# Patient Record
Sex: Male | Born: 1975 | Race: White | Hispanic: No | Marital: Single | Smoking: Never smoker
Health system: Southern US, Community
[De-identification: ages and names within clinical notes are randomized; demographics above are authoritative.]

## PROBLEM LIST (undated history)

## (undated) DIAGNOSIS — E119 Type 2 diabetes mellitus without complications: Secondary | ICD-10-CM

## (undated) HISTORY — PX: MOLE REMOVAL: SHX2046

## (undated) HISTORY — DX: Type 2 diabetes mellitus without complications: E11.9

## (undated) HISTORY — PX: MOUTH SURGERY: SHX715

---

## 2014-01-31 LAB — HEPATIC FUNCTION PANEL
ALT: 38 U/L (ref 10–40)
AST: 24 U/L (ref 14–40)
Alkaline Phosphatase: 88 U/L (ref 25–125)
Bilirubin, Total: 0.7 mg/dL

## 2014-01-31 LAB — TSH: TSH: 3.33 u[IU]/mL (ref 0.41–5.90)

## 2014-01-31 LAB — LIPID PANEL
Cholesterol: 222 mg/dL — AB (ref 0–200)
HDL: 52 mg/dL (ref 35–70)
LDL Cholesterol: 136 mg/dL
LDL/HDL RATIO: 2.6
Triglycerides: 170 mg/dL — AB (ref 40–160)

## 2014-01-31 LAB — BASIC METABOLIC PANEL
BUN: 12 mg/dL (ref 4–21)
CREATININE: 1.2 mg/dL (ref 0.6–1.3)
Glucose: 114 mg/dL
Potassium: 4.2 mmol/L (ref 3.4–5.3)
SODIUM: 139 mmol/L (ref 137–147)

## 2014-01-31 LAB — CBC AND DIFFERENTIAL
HCT: 44 % (ref 41–53)
HEMOGLOBIN: 15.2 g/dL (ref 13.5–17.5)
NEUTROS ABS: 3 /uL
Platelets: 277 10*3/uL (ref 150–399)
WBC: 6.9 10^3/mL

## 2014-11-07 ENCOUNTER — Ambulatory Visit
Admission: RE | Admit: 2014-11-07 | Discharge: 2014-11-07 | Disposition: A | Discharge status: Home / Self Care | Payer: BC Managed Care – PPO | Source: Ambulatory Visit | Attending: Family Medicine | Admitting: Family Medicine

## 2014-11-07 ENCOUNTER — Ambulatory Visit
Admission: RE | Admit: 2014-11-07 | Discharge: 2014-11-07 | Disposition: A | Discharge status: Home / Self Care | Payer: BC Managed Care – PPO | Attending: Family Medicine | Admitting: Family Medicine

## 2014-11-07 ENCOUNTER — Other Ambulatory Visit: Payer: Self-pay | Admitting: Family Medicine

## 2014-11-07 DIAGNOSIS — M5134 Other intervertebral disc degeneration, thoracic region: Secondary | ICD-10-CM | POA: Diagnosis not present

## 2014-11-07 DIAGNOSIS — M549 Dorsalgia, unspecified: Secondary | ICD-10-CM | POA: Diagnosis present

## 2015-05-30 DIAGNOSIS — M5134 Other intervertebral disc degeneration, thoracic region: Secondary | ICD-10-CM | POA: Insufficient documentation

## 2015-05-30 DIAGNOSIS — D229 Melanocytic nevi, unspecified: Secondary | ICD-10-CM | POA: Insufficient documentation

## 2015-05-30 DIAGNOSIS — IMO0002 Reserved for concepts with insufficient information to code with codable children: Secondary | ICD-10-CM | POA: Insufficient documentation

## 2015-06-06 ENCOUNTER — Ambulatory Visit (INDEPENDENT_AMBULATORY_CARE_PROVIDER_SITE_OTHER): Payer: BC Managed Care – PPO | Admitting: Family Medicine

## 2015-06-06 ENCOUNTER — Encounter: Payer: Self-pay | Admitting: Family Medicine

## 2015-06-06 VITALS — BP 128/82 | HR 68 | Temp 98.0°F | Resp 16 | Ht 72.0 in | Wt 280.0 lb

## 2015-06-06 DIAGNOSIS — Z0000 Encounter for general adult medical examination without abnormal findings: Secondary | ICD-10-CM

## 2015-06-06 NOTE — Progress Notes (Signed)
Patient ID: Connor Berger, male   DOB: Oct 05, 1975, 39 y.o.   MRN: [id number]  Visit Date: 06/06/2015  Today's Provider: Wilhemena Durie, MD   Chief Complaint  Patient presents with  . Annual Exam   Subjective:  Connor Berger is a 39 y.o. male who presents today for health maintenance and complete physical. He feels well. He  Does try to walk as much as he can and do back stretching exercise  He reports he is sleeping well. Immunization History  Administered Date(s) Administered  . Tdap 01/25/2013     Review of Systems  Constitutional: Negative.   HENT: Negative.   Eyes: Negative.   Respiratory: Negative.   Cardiovascular: Negative.   Gastrointestinal: Negative.   Endocrine: Negative.   Genitourinary: Negative.   Musculoskeletal: Positive for back pain.  Skin: Negative.   Allergic/Immunologic: Negative.   Neurological: Negative.   Hematological: Negative.   Psychiatric/Behavioral: Negative.     Social History   Social History  . Marital Status: Single    Spouse Name: N/A  . Number of Children: N/A  . Years of Education: N/A   Occupational History  . Not on file.   Social History Main Topics  . Smoking status: Never Smoker   . Smokeless tobacco: Never Used  . Alcohol Use: No  . Drug Use: No  . Sexual Activity: Not on file   Other Topics Concern  . Not on file   Social History Narrative    Patient Active Problem List   Diagnosis Date Noted  . 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 Adult BMI 30+ 05/30/2015    Past Surgical History  Procedure Laterality Date  . Mouth surgery    . Mole removal      not sure if it was cancerous    His family history includes Diabetes in his father and maternal grandmother.    Outpatient Prescriptions Prior to Visit  Medication Sig Dispense Refill  . cyclobenzaprine (FLEXERIL) 10 MG tablet Take by mouth.    . naproxen (NAPROSYN) 500 MG tablet Take  by mouth.     No facility-administered medications prior to visit.    Patient Care Team: Jerrol Banana., MD as PCP - General (Family Medicine)     Objective:   Vitals:  Filed Vitals:   06/06/15 0934  BP: 128/82  Pulse: 68  Temp: 98 F (36.7 C)  Resp: 16  Height: 6' (1.829 m)  Weight: 280 lb (127.007 kg)    Physical Exam     Assessment & Plan:   1. Annual physical exam DRE and PSA at age 25. - POCT urinalysis dipstick - CBC with Differential/Platelet - Comprehensive metabolic panel - Lipid Panel With LDL/HDL Ratio - TSH 2. Chronic thoracic back pain  Mild DDD noted. Chiropractor is helping. He does not seem to have any pain above or below this area. Will workup or treatment as indicated.  3. Dysplastic nevus  He has 2 lesions on his back that need follow-up with Dr. Evorn Gong next year.  4. Morbid obesity  Patient has no bad habits so diet and exercise is discussed at length.  I have done the exam and reviewed the above chart and it is accurate to the best of my knowledge.

## 2015-06-07 ENCOUNTER — Telehealth: Payer: Self-pay

## 2015-06-07 LAB — COMPREHENSIVE METABOLIC PANEL
A/G RATIO: 1.8 (ref 1.1–2.5)
ALBUMIN: 4.3 g/dL (ref 3.5–5.5)
ALK PHOS: 120 IU/L — AB (ref 39–117)
ALT: 82 IU/L — AB (ref 0–44)
AST: 46 IU/L — ABNORMAL HIGH (ref 0–40)
BILIRUBIN TOTAL: 0.7 mg/dL (ref 0.0–1.2)
BUN / CREAT RATIO: 10 (ref 8–19)
BUN: 10 mg/dL (ref 6–20)
CHLORIDE: 100 mmol/L (ref 97–106)
CO2: 26 mmol/L (ref 18–29)
Calcium: 9.8 mg/dL (ref 8.7–10.2)
Creatinine, Ser: 1.02 mg/dL (ref 0.76–1.27)
GFR calc non Af Amer: 93 mL/min/{1.73_m2} (ref >59)
GFR, EST AFRICAN AMERICAN: 108 mL/min/{1.73_m2} (ref >59)
Globulin, Total: 2.4 g/dL (ref 1.5–4.5)
Glucose: 145 mg/dL — ABNORMAL HIGH (ref 65–99)
POTASSIUM: 4.3 mmol/L (ref 3.5–5.2)
Sodium: 140 mmol/L (ref 136–144)
Total Protein: 6.7 g/dL (ref 6.0–8.5)

## 2015-06-07 LAB — CBC WITH DIFFERENTIAL/PLATELET
Basophils Absolute: 0.1 10*3/uL (ref 0.0–0.2)
Basos: 1 %
EOS (ABSOLUTE): 0.2 10*3/uL (ref 0.0–0.4)
EOS: 3 %
HEMATOCRIT: 46 % (ref 37.5–51.0)
HEMOGLOBIN: 16.1 g/dL (ref 12.6–17.7)
Immature Grans (Abs): 0 10*3/uL (ref 0.0–0.1)
Immature Granulocytes: 1 %
LYMPHS ABS: 2.4 10*3/uL (ref 0.7–3.1)
Lymphs: 37 %
MCH: 30.7 pg (ref 26.6–33.0)
MCHC: 35 g/dL (ref 31.5–35.7)
MCV: 88 fL (ref 79–97)
MONOCYTES: 8 %
MONOS ABS: 0.5 10*3/uL (ref 0.1–0.9)
NEUTROS ABS: 3.3 10*3/uL (ref 1.4–7.0)
Neutrophils: 50 %
Platelets: 286 10*3/uL (ref 150–379)
RBC: 5.25 x10E6/uL (ref 4.14–5.80)
RDW: 13.8 % (ref 12.3–15.4)
WBC: 6.5 10*3/uL (ref 3.4–10.8)

## 2015-06-07 LAB — LIPID PANEL WITH LDL/HDL RATIO
CHOLESTEROL TOTAL: 198 mg/dL (ref 100–199)
HDL: 47 mg/dL (ref >39)
LDL Calculated: 117 mg/dL — ABNORMAL HIGH (ref 0–99)
LDl/HDL Ratio: 2.5 ratio units (ref 0.0–3.6)
Triglycerides: 171 mg/dL — ABNORMAL HIGH (ref 0–149)
VLDL CHOLESTEROL CAL: 34 mg/dL (ref 5–40)

## 2015-06-07 LAB — TSH: TSH: 1.66 u[IU]/mL (ref 0.450–4.500)

## 2015-06-07 NOTE — Telephone Encounter (Signed)
-----   Message from Jerrol Banana., MD sent at 06/07/2015  1:46 PM EST ----- If fasting labs then this is early DM. OV in December to discuss how to apprioach this in this 39yo man.

## 2015-06-07 NOTE — Telephone Encounter (Signed)
Left message to call back  

## 2015-06-11 NOTE — Telephone Encounter (Signed)
[id number] 

## 2015-06-14 NOTE — Telephone Encounter (Signed)
Pt advised of lab results on his voicemail-aa

## 2015-06-26 ENCOUNTER — Ambulatory Visit (INDEPENDENT_AMBULATORY_CARE_PROVIDER_SITE_OTHER): Payer: BC Managed Care – PPO | Admitting: Family Medicine

## 2015-06-26 ENCOUNTER — Encounter: Payer: Self-pay | Admitting: Family Medicine

## 2015-06-26 VITALS — BP 118/72 | HR 80 | Temp 98.1°F | Resp 16 | Wt 287.0 lb

## 2015-06-26 DIAGNOSIS — E119 Type 2 diabetes mellitus without complications: Secondary | ICD-10-CM

## 2015-06-26 MED ORDER — METFORMIN HCL 500 MG PO TABS: 500.0000 mg | ORAL_TABLET | Freq: Every day | ORAL | Status: DC

## 2015-06-26 NOTE — Progress Notes (Signed)
Patient ID: Connor Berger, male   DOB: 1976/02/05, 39 y.o.   MRN: [id number]    Subjective:  HPI  Patient is here to follow up on elevates sugar level on his recent labs. He was fasting and his Glucose was 145 at that time. He has not had trouble with his sugar that he knows of. He does have family history of diabetes.  Prior to Admission medications   Medication Sig Start Date End Date Taking? Authorizing Provider  cyclobenzaprine (FLEXERIL) 10 MG tablet Take by mouth. 11/07/14  Yes Historical Provider, MD  Multiple Vitamin (MULTIVITAMIN) tablet Take 1 tablet by mouth daily.   Yes Historical Provider, MD  naproxen (NAPROSYN) 500 MG tablet Take by mouth. 10/24/14  Yes Historical Provider, MD    Patient Active Problem List   Diagnosis Date Noted  . 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 Adult BMI 30+ 05/30/2015    No past medical history on file.  Social History   Social History  . Marital Status: Single    Spouse Name: N/A  . Number of Children: N/A  . Years of Education: N/A   Occupational History  . Not on file.   Social History Main Topics  . Smoking status: Never Smoker   . Smokeless tobacco: Never Used  . Alcohol Use: No  . Drug Use: No  . Sexual Activity: Not on file   Other Topics Concern  . Not on file   Social History Narrative    No Known Allergies  Review of Systems  Constitutional: Negative.   Respiratory: Negative.   Cardiovascular: Negative.   Gastrointestinal: Negative.   Musculoskeletal: Positive for back pain and joint pain.  Neurological: Negative.   Endo/Heme/Allergies: Negative.   Psychiatric/Behavioral: Negative.     Immunization History  Administered Date(s) Administered  . Tdap 01/25/2013   Objective:  BP 118/72 mmHg  Pulse 80  Temp(Src) 98.1 F (36.7 C)  Resp 16  Wt 287 lb (130.182 kg)  Physical Exam  Constitutional: He is oriented to person, place, and time and  well-developed, well-nourished, and in no distress.  Obes WM NAD.  HENT:  Head: Normocephalic and atraumatic.  Eyes: Conjunctivae are normal. Pupils are equal, round, and reactive to light.  Neck: Normal range of motion. Neck supple.  Cardiovascular: Normal rate, regular rhythm, normal heart sounds and intact distal pulses.   No murmur heard. Pulmonary/Chest: Effort normal and breath sounds normal. No respiratory distress. He has no wheezes.  Musculoskeletal: Normal range of motion. He exhibits no edema or tenderness.  Neurological: He is alert and oriented to person, place, and time.  Skin: Skin is warm and dry.  Psychiatric: Mood, memory, affect and judgment normal.    Lab Results  Component Value Date   WBC 6.5 06/06/2015   HGB 15.2 01/31/2014   HCT 46.0 06/06/2015   PLT 277 01/31/2014   GLUCOSE 145* 06/06/2015   CHOL 198 06/06/2015   TRIG 171* 06/06/2015   HDL 47 06/06/2015   LDLCALC 117* 06/06/2015   TSH 1.660 06/06/2015    CMP     Component Value Date/Time   NA 140 06/06/2015 1017   K 4.3 06/06/2015 1017   CL 100 06/06/2015 1017   CO2 26 06/06/2015 1017   GLUCOSE 145* 06/06/2015 1017   BUN 10 06/06/2015 1017   CREATININE 1.02 06/06/2015 1017   CREATININE 1.2 01/31/2014   CALCIUM 9.8 06/06/2015 1017   PROT 6.7 06/06/2015 1017  ALBUMIN 4.3 06/06/2015 1017   AST 46* 06/06/2015 1017   ALT 82* 06/06/2015 1017   ALKPHOS 120* 06/06/2015 1017   BILITOT 0.7 06/06/2015 1017   GFRNONAA 93 06/06/2015 1017   GFRAA 108 06/06/2015 1017    Assessment and Plan :  1. Type 2 diabetes mellitus without complication, without long-term current use of insulin ([place]) New. A1C today 8.6. Start Metformin and refer to lifestyle also. - POCT HgB A1C--8.6 today. RTC 4 months. [place] MD [facility] 06/26/2015 3:22 PM

## 2015-07-23 ENCOUNTER — Encounter: Payer: Self-pay | Admitting: Physician Assistant

## 2015-07-23 ENCOUNTER — Ambulatory Visit (INDEPENDENT_AMBULATORY_CARE_PROVIDER_SITE_OTHER): Payer: BC Managed Care – PPO | Admitting: Physician Assistant

## 2015-07-23 VITALS — BP 120/70 | HR 88 | Temp 100.2°F | Resp 16 | Wt 274.6 lb

## 2015-07-23 DIAGNOSIS — J0110 Acute frontal sinusitis, unspecified: Secondary | ICD-10-CM

## 2015-07-23 DIAGNOSIS — R059 Cough, unspecified: Secondary | ICD-10-CM

## 2015-07-23 DIAGNOSIS — R05 Cough: Secondary | ICD-10-CM

## 2015-07-23 MED ORDER — HYDROCODONE-HOMATROPINE 5-1.5 MG/5ML PO SYRP: 5.0000 mL | ORAL_SOLUTION | Freq: Three times a day (TID) | ORAL | Status: DC | PRN

## 2015-07-23 MED ORDER — AMOXICILLIN-POT CLAVULANATE 875-125 MG PO TABS: 1.0000 | ORAL_TABLET | Freq: Two times a day (BID) | ORAL | Status: DC

## 2015-07-23 NOTE — Patient Instructions (Signed)

## 2015-07-23 NOTE — Progress Notes (Signed)
Patient: Connor Berger Male    DOB: May 22, 1976   40 y.o.   MRN: [id number] Visit Date: 07/23/2015  Today's Provider: Mar Daring, PA-C   Chief Complaint  Patient presents with  . URI   Subjective:    URI  This is a new problem. The current episode started in the past 7 days. There has been no fever (just feeling warm). Associated symptoms include congestion, coughing, headaches (off and on since Christmas), rhinorrhea, sinus pain and sneezing. Pertinent negatives include no abdominal pain, chest pain, ear pain, nausea, plugged ear sensation, rash, sore throat, vomiting or wheezing.      No Known Allergies Previous Medications   CYCLOBENZAPRINE (FLEXERIL) 10 MG TABLET    Take by mouth.   METFORMIN (GLUCOPHAGE) 500 MG TABLET    Take 1 tablet (500 mg total) by mouth daily with breakfast.   MULTIPLE VITAMIN (MULTIVITAMIN) TABLET    Take 1 tablet by mouth daily.   NAPROXEN (NAPROSYN) 500 MG TABLET    Take by mouth. Reported on 07/23/2015    Review of Systems  Constitutional: Positive for chills and fatigue.  HENT: Positive for congestion, rhinorrhea, sinus pressure and sneezing. Negative for ear pain and sore throat.   Respiratory: Positive for cough. Negative for chest tightness, shortness of breath and wheezing.   Cardiovascular: Negative for chest pain.  Gastrointestinal: Negative for nausea, vomiting and abdominal pain.  Skin: Negative for rash.  Neurological: Positive for headaches (off and on since Christmas). Negative for dizziness.  All other systems reviewed and are negative.   Social History  Substance Use Topics  . Smoking status: Never Smoker   . Smokeless tobacco: Never Used  . Alcohol Use: No   Objective:   BP 120/70 mmHg  Pulse 88  Temp(Src) 100.2 F (37.9 C) (Oral)  Resp 16  Wt 274 lb 9.6 oz (124.558 kg)  SpO2 98%  Physical Exam  Constitutional: He appears well-developed and well-nourished. No distress.  HENT:  Head: Normocephalic and  atraumatic.  Right Ear: Hearing, tympanic membrane, external ear and ear canal normal. Tympanic membrane is not erythematous and not bulging. No middle ear effusion.  Left Ear: Hearing, tympanic membrane, external ear and ear canal normal. Tympanic membrane is not erythematous and not bulging.  No middle ear effusion.  Nose: Mucosal edema and rhinorrhea present. Right sinus exhibits frontal sinus tenderness. Right sinus exhibits no maxillary sinus tenderness. Left sinus exhibits frontal sinus tenderness. Left sinus exhibits no maxillary sinus tenderness.  Mouth/Throat: Uvula is midline, oropharynx is clear and moist and mucous membranes are normal. No oropharyngeal exudate, posterior oropharyngeal edema or posterior oropharyngeal erythema.  Eyes: Conjunctivae and EOM are normal. Pupils are equal, round, and reactive to light. Right eye exhibits no discharge. Left eye exhibits no discharge.  Neck: Normal range of motion. Neck supple. No tracheal deviation present. No Brudzinski's sign and no Kernig's sign noted. No thyromegaly present.  Cardiovascular: Normal rate, regular rhythm and normal heart sounds.  Exam reveals no gallop and no friction rub.   No murmur heard. Pulmonary/Chest: Effort normal and breath sounds normal. No stridor. No respiratory distress. He has no wheezes. He has no rales.  Lymphadenopathy:    He has no cervical adenopathy.  Skin: Skin is warm and dry. He is not diaphoretic.  Vitals reviewed.       Assessment & Plan:     1. Acute frontal sinusitis, recurrence not specified Worsening symptoms since Christmas without response to OTC  medications. I will treat with Augmentin as below. Advised him to make sure that he stays well hydrated and get plenty of rest. He may use Mucinex DM for congestion. Office if symptoms fail to improve or worsen. - amoxicillin-clavulanate (AUGMENTIN) 875-125 MG tablet; Take 1 tablet by mouth 2 (two) times daily.  Dispense: 20 tablet; Refill:  0  2. Cough Worsening nighttime cough over the last 3 days. I will prescribe Hycodan cough syrup as below. He is to call the office if cough worsens or if he developedshortness of breath or wheezing. - HYDROcodone-homatropine (HYCODAN) 5-1.5 MG/5ML syrup; Take 5 mLs by mouth every 8 (eight) hours as needed for cough.  Dispense: 120 mL; Refill: 0       Mar Daring, PA-C  [facility]

## 2015-10-29 ENCOUNTER — Ambulatory Visit (INDEPENDENT_AMBULATORY_CARE_PROVIDER_SITE_OTHER): Payer: BC Managed Care – PPO | Admitting: Family Medicine

## 2015-10-29 VITALS — BP 112/76 | HR 64 | Temp 97.9°F | Resp 16 | Wt 267.0 lb

## 2015-10-29 DIAGNOSIS — E119 Type 2 diabetes mellitus without complications: Secondary | ICD-10-CM

## 2015-10-29 NOTE — Progress Notes (Signed)
Patient ID: Connor Berger, male   DOB: 1976/06/08, 40 y.o.   MRN: [id number]   Connor Berger  MRN: [id number] DOB: 1976/04/08  Subjective:  HPI   1. Type 2 diabetes mellitus without complication, without long-term current use of insulin [facility]) The patient is a 40 year old male who is a newly diagnosed diabetic.  He was last seen for this on 06/26/15.  He was started on Metformin 500 mg daily at that time.  He reports that he checks his glucose a few times per week and reports his readings have ranged 78-100.  He did not go to the [facility] per our referral.  He stated that he has 2 family members with diabetes and they are instructing him on diet.  While in the office today I have given him information of eye and foot care in diabetics and he voices Connor understanding.  During his December visit Connor A1C was ordered but the patient did not have this done.   Patient Active Problem List   Diagnosis Date Noted  . 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 Adult BMI 30+ 05/30/2015    No past medical history on file.  Social History   Social History  . Marital Status: Single    Spouse Name: N/A  . Number of Children: N/A  . Years of Education: N/A   Occupational History  . Not on file.   Social History Main Topics  . Smoking status: Never Smoker   . Smokeless tobacco: Never Used  . Alcohol Use: No  . Drug Use: No  . Sexual Activity: Not on file   Other Topics Concern  . Not on file   Social History Narrative    Outpatient Prescriptions Prior to Visit  Medication Sig Dispense Refill  . cyclobenzaprine (FLEXERIL) 10 MG tablet Take by mouth.    . metFORMIN (GLUCOPHAGE) 500 MG tablet Take 1 tablet (500 mg total) by mouth daily with breakfast. 90 tablet 3  . Multiple Vitamin (MULTIVITAMIN) tablet Take 1 tablet by mouth daily.    . naproxen (NAPROSYN) 500 MG tablet Take by mouth. Reported on 07/23/2015    .  amoxicillin-clavulanate (AUGMENTIN) 875-125 MG tablet Take 1 tablet by mouth 2 (two) times daily. 20 tablet 0  . HYDROcodone-homatropine (HYCODAN) 5-1.5 MG/5ML syrup Take 5 mLs by mouth every 8 (eight) hours as needed for cough. 120 mL 0   No facility-administered medications prior to visit.   Outpatient Encounter Prescriptions as of 10/29/2015  Medication Sig Note  . cyclobenzaprine (FLEXERIL) 10 MG tablet Take by mouth. 05/30/2015: Received from: [facility]  . metFORMIN (GLUCOPHAGE) 500 MG tablet Take 1 tablet (500 mg total) by mouth daily with breakfast.   . Multiple Vitamin (MULTIVITAMIN) tablet Take 1 tablet by mouth daily.   . naproxen (NAPROSYN) 500 MG tablet Take by mouth. Reported on 07/23/2015 05/30/2015: Received from: [facility]  . [DISCONTINUED] amoxicillin-clavulanate (AUGMENTIN) 875-125 MG tablet Take 1 tablet by mouth 2 (two) times daily.   . [DISCONTINUED] HYDROcodone-homatropine (HYCODAN) 5-1.5 MG/5ML syrup Take 5 mLs by mouth every 8 (eight) hours as needed for cough.    No facility-administered encounter medications on file as of 10/29/2015.   No Known Allergies  Review of Systems  Constitutional: Negative for fever and malaise/fatigue.  Eyes: Negative.   Respiratory: Negative for cough, shortness of breath and wheezing.   Cardiovascular: Negative for chest pain, palpitations, orthopnea, claudication and leg swelling.  Gastrointestinal: Negative.   Neurological: Negative for dizziness, weakness and headaches.  Endo/Heme/Allergies: Negative.   Psychiatric/Behavioral: Negative.    Objective:  BP 112/76 mmHg  Pulse 64  Temp(Src) 97.9 F (36.6 C) (Oral)  Resp 16  Wt 267 lb (121.11 kg)  Physical Exam  Constitutional: He is oriented to person, place, and time and well-developed, well-nourished, and in no distress.  HENT:  Head: Normocephalic.  Eyes: Pupils are equal, round, and reactive to light.  Neck: Normal range of motion.    Cardiovascular: Normal rate, regular rhythm and normal heart sounds.   Pulmonary/Chest: Effort normal and breath sounds normal.  Musculoskeletal: Edema: trace bilat.  Neurological: He is alert and oriented to person, place, and time. Gait normal.  Skin: Skin is warm and dry.  Psychiatric: Mood, memory, affect and judgment normal.    Assessment and Plan :   1. Type 2 diabetes mellitus without complication, without long-term current use of insulin ([facility])  - Comprehensive metabolic panel - Hemoglobin A1c 2. Obesity D and E stressed to pt. I have done the exam and reviewed the above chart and it is accurate to the best of my knowledge.  Miguel Aschoff MD [facility] 10/29/2015 9:47 AM

## 2015-10-30 ENCOUNTER — Telehealth: Payer: Self-pay

## 2015-10-30 LAB — COMPREHENSIVE METABOLIC PANEL
ALT: 24 IU/L (ref 0–44)
AST: 24 IU/L (ref 0–40)
Albumin/Globulin Ratio: 1.7 (ref 1.2–2.2)
Albumin: 4.4 g/dL (ref 3.5–5.5)
Alkaline Phosphatase: 96 IU/L (ref 39–117)
BUN/Creatinine Ratio: 13 (ref 9–20)
BUN: 14 mg/dL (ref 6–20)
Bilirubin Total: 0.7 mg/dL (ref 0.0–1.2)
CALCIUM: 9.6 mg/dL (ref 8.7–10.2)
CO2: 27 mmol/L (ref 18–29)
Chloride: 101 mmol/L (ref 96–106)
Creatinine, Ser: 1.11 mg/dL (ref 0.76–1.27)
GFR calc Af Amer: 98 mL/min/{1.73_m2} (ref >59)
GFR calc non Af Amer: 84 mL/min/{1.73_m2} (ref >59)
Globulin, Total: 2.6 g/dL (ref 1.5–4.5)
Glucose: 105 mg/dL — ABNORMAL HIGH (ref 65–99)
Potassium: 4.4 mmol/L (ref 3.5–5.2)
Sodium: 142 mmol/L (ref 134–144)
Total Protein: 7 g/dL (ref 6.0–8.5)

## 2015-10-30 LAB — HEMOGLOBIN A1C
ESTIMATED AVERAGE GLUCOSE: 163 mg/dL
HEMOGLOBIN A1C: 7.3 % — AB (ref 4.8–5.6)

## 2015-10-30 NOTE — Telephone Encounter (Signed)
-----   Message from Jerrol Banana., MD sent at 10/30/2015  8:59 AM EDT ----- Liver functions slightly improved from last visit, back to normal. Diabetes control good with hemoglobin A1c of 7.3. Continue to work on diet and exercise.

## 2015-10-30 NOTE — Telephone Encounter (Signed)
Left message to call back  

## 2015-11-05 NOTE — Telephone Encounter (Signed)
[id number] 

## 2015-11-07 NOTE — Telephone Encounter (Signed)
Called and spoke to his mother, patient works till 5 pm and we always have hard time getting back in touch with him, advised mother of results and advised her to let patient know that if he has further questions he can call us back-aa

## 2015-11-09 NOTE — Telephone Encounter (Signed)
Advised patient of results.  

## 2016-01-17 ENCOUNTER — Encounter: Payer: Self-pay | Admitting: Physician Assistant

## 2016-01-17 ENCOUNTER — Ambulatory Visit (INDEPENDENT_AMBULATORY_CARE_PROVIDER_SITE_OTHER): Payer: BC Managed Care – PPO | Admitting: Physician Assistant

## 2016-01-17 VITALS — BP 116/80 | HR 76 | Temp 98.2°F | Resp 16 | Wt 266.4 lb

## 2016-01-17 DIAGNOSIS — L259 Unspecified contact dermatitis, unspecified cause: Secondary | ICD-10-CM | POA: Diagnosis not present

## 2016-01-17 MED ORDER — PREDNISONE 10 MG (21) PO TBPK: ORAL_TABLET | ORAL | Status: DC

## 2016-01-17 NOTE — Progress Notes (Signed)
       Patient: Connor Berger    DOB: Jul 11, 1975   40 y.o.   MRN: [id number] Visit Date: 01/17/2016  Today's Provider: Mar Daring, PA-C   Chief Complaint  Patient presents with  . Rash   Subjective:    Rash This is a new problem. The current episode started 1 to 4 weeks ago (over a week ago ). The problem is unchanged. The affected locations include the left lower leg and right lowerleg. The rash is characterized by itchiness and redness. It is unknown if there was an exposure to a precipitant. Pertinent negatives include no congestion, cough, facial edema, fatigue, fever, joint pain, rhinorrhea, shortness of breath or sore throat. Past treatments include topical steroids and antibiotic cream (Aloe Vera it only helps with the itching and Neosporin). The treatment provided no relief.      No Known Allergies Current Meds  Medication Sig  . metFORMIN (GLUCOPHAGE) 500 MG tablet Take 1 tablet (500 mg total) by mouth daily with breakfast.  . Multiple Vitamin (MULTIVITAMIN) tablet Take 1 tablet by mouth daily.    Review of Systems  Constitutional: Negative for fever and fatigue.  HENT: Negative for congestion, rhinorrhea and sore throat.   Respiratory: Negative for cough, chest tightness and shortness of breath.   Cardiovascular: Negative.   Gastrointestinal: Negative.   Musculoskeletal: Negative for joint pain.  Skin: Positive for rash.    Social History  Substance Use Topics  . Smoking status: Never Smoker   . Smokeless tobacco: Never Used  . Alcohol Use: No   Objective:   BP 116/80 mmHg  Pulse 76  Temp(Src) 98.2 F (36.8 C) (Oral)  Resp 16  Wt 266 lb 6.4 oz (120.838 kg)  Physical Exam  Constitutional: He appears well-developed and well-nourished. No distress.  HENT:  Head: Normocephalic and atraumatic.  Cardiovascular: Normal rate, regular rhythm and normal heart sounds.  Exam reveals no gallop and no friction rub.   No murmur  heard. Pulmonary/Chest: Effort normal and breath sounds normal. No respiratory distress. He has no wheezes. He has no rales.  Skin: Rash noted. Rash is vesicular. He is not diaphoretic.     Vitals reviewed.       Assessment & Plan:     1. Contact dermatitis Worsening despite topical treatments. Will add prednisone as below. Advised to try topical hydrocortisone cream and/or calamine lotion for itch. He agrees. He is to call the office if symptoms fail to improve or worsen. - predniSONE (STERAPRED UNI-PAK 21 TAB) 10 MG (21) TBPK tablet; Take as directed on package instructions  Dispense: 21 tablet; Refill: 0       Mar Daring, PA-C  [facility]

## 2016-01-17 NOTE — Patient Instructions (Signed)
Contact Dermatitis Dermatitis is redness, soreness, and swelling (inflammation) of the skin. Contact dermatitis is a reaction to certain substances that touch the skin. There are two types of contact dermatitis:   Irritant contact dermatitis. This type is caused by something that irritates your skin, such as dry hands from washing them too much. This type does not require previous exposure to the substance for a reaction to occur. This type is more common.  Allergic contact dermatitis. This type is caused by a substance that you are allergic to, such as a nickel allergy or poison ivy. This type only occurs if you have been exposed to the substance (allergen) before. Upon a repeat exposure, your body reacts to the substance. This type is less common. CAUSES  Many different substances can cause contact dermatitis. Irritant contact dermatitis is most commonly caused by exposure to:   Makeup.   Soaps.   Detergents.   Bleaches.   Acids.   Metal salts, such as nickel.  Allergic contact dermatitis is most commonly caused by exposure to:   Poisonous plants.   Chemicals.   Jewelry.   Latex.   Medicines.   Preservatives in products, such as clothing.  RISK FACTORS This condition is more likely to develop in:   People who have jobs that expose them to irritants or allergens.  People who have certain medical conditions, such as asthma or eczema.  SYMPTOMS  Symptoms of this condition may occur anywhere on your body where the irritant has touched you or is touched by you. Symptoms include:  Dryness or flaking.   Redness.   Cracks.   Itching.   Pain or a burning feeling.   Blisters.  Drainage of small amounts of blood or clear fluid from skin cracks. With allergic contact dermatitis, there may also be swelling in areas such as the eyelids, mouth, or genitals.  DIAGNOSIS  This condition is diagnosed with a medical history and physical exam. A patch skin test  may be performed to help determine the cause. If the condition is related to your job, you may need to see an occupational medicine specialist. TREATMENT Treatment for this condition includes figuring out what caused the reaction and protecting your skin from further contact. Treatment may also include:   Steroid creams or ointments. Oral steroid medicines may be needed in more severe cases.  Antibiotics or antibacterial ointments, if a skin infection is present.  Antihistamine lotion or an antihistamine taken by mouth to ease itching.  A bandage (dressing). HOME CARE INSTRUCTIONS Skin Care  Moisturize your skin as needed.   Apply cool compresses to the affected areas.  Try taking a bath with:  Epsom salts. Follow the instructions on the packaging. You can get these at your local pharmacy or grocery store.  Baking soda. Pour a small amount into the bath as directed by your health care provider.  Colloidal oatmeal. Follow the instructions on the packaging. You can get this at your local pharmacy or grocery store.  Try applying baking soda paste to your skin. Stir water into baking soda until it reaches a paste-like consistency.  Do not scratch your skin.  Bathe less frequently, such as every other day.  Bathe in lukewarm water. Avoid using hot water. Medicines  Take or apply over-the-counter and prescription medicines only as told by your health care provider.   If you were prescribed an antibiotic medicine, take or apply your antibiotic as told by your health care provider. Do not stop using the   antibiotic even if your condition starts to improve. General Instructions  Keep all follow-up visits as told by your health care provider. This is important.  Avoid the substance that caused your reaction. If you do not know what caused it, keep a journal to try to track what caused it. Write down:  What you eat.  What cosmetic products you use.  What you drink.  What  you wear in the affected area. This includes jewelry.  If you were given a dressing, take care of it as told by your health care provider. This includes when to change and remove it. SEEK MEDICAL CARE IF:   Your condition does not improve with treatment.  Your condition gets worse.  You have signs of infection such as swelling, tenderness, redness, soreness, or warmth in the affected area.  You have a fever.  You have new symptoms. SEEK IMMEDIATE MEDICAL CARE IF:   You have a severe headache, neck pain, or neck stiffness.  You vomit.  You feel very sleepy.  You notice red streaks coming from the affected area.  Your bone or joint underneath the affected area becomes painful after the skin has healed.  The affected area turns darker.  You have difficulty breathing.   This information is not intended to replace advice given to you by your health care provider. Make sure you discuss any questions you have with your health care provider.   Document Released: 06/20/2000 Document Revised: 03/14/2015 Document Reviewed: 11/08/2014 Elsevier Interactive Patient Education 2016 [employer]  

## 2016-01-18 ENCOUNTER — Encounter: Payer: Self-pay | Admitting: Physician Assistant

## 2016-03-03 ENCOUNTER — Encounter: Payer: Self-pay | Admitting: Family Medicine

## 2016-03-03 ENCOUNTER — Ambulatory Visit (INDEPENDENT_AMBULATORY_CARE_PROVIDER_SITE_OTHER): Payer: BC Managed Care – PPO | Admitting: Family Medicine

## 2016-03-03 VITALS — BP 110/68 | HR 80 | Temp 98.0°F | Resp 16 | Wt 261.0 lb

## 2016-03-03 DIAGNOSIS — E119 Type 2 diabetes mellitus without complications: Secondary | ICD-10-CM

## 2016-03-03 LAB — POCT GLYCOSYLATED HEMOGLOBIN (HGB A1C): HEMOGLOBIN A1C: 7

## 2016-03-03 LAB — POCT UA - MICROALBUMIN: Microalbumin Ur, POC: 50 mg/L

## 2016-03-03 MED ORDER — METFORMIN HCL 1000 MG PO TABS: 1000.0000 mg | ORAL_TABLET | Freq: Every day | ORAL | 12 refills | Status: DC

## 2016-03-03 MED ORDER — LOSARTAN POTASSIUM 25 MG PO TABS: 25.0000 mg | ORAL_TABLET | Freq: Every day | ORAL | 12 refills | Status: DC

## 2016-03-03 NOTE — Progress Notes (Signed)
Subjective:  HPI  Diabetes Mellitus Type II, Follow-up:   Lab Results  Component Value Date   HGBA1C 7.3 (H) 10/29/2015    Last seen for diabetes 3 months ago.  Management since then includes none. He reports good compliance with treatment. He is not having side effects.  Current symptoms include none and have been unchanged. Home blood sugar records: 100-150's  Episodes of hypoglycemia? no   Current Insulin Regimen: n/a Most Recent Eye Exam: 03/2015 Weight trend: stable Prior visit with dietician: no Current diet: not asked Current exercise: walking daily and more on the weekends.   Pertinent Labs:    Component Value Date/Time   CHOL 198 06/06/2015 1017   TRIG 171 (H) 06/06/2015 1017   HDL 47 06/06/2015 1017   LDLCALC 117 (H) 06/06/2015 1017   CREATININE 1.11 10/29/2015 1031    Wt Readings from Last 3 Encounters:  03/03/16 261 lb (118.4 kg)  01/17/16 266 lb 6.4 oz (120.8 kg)  10/29/15 267 lb (121.1 kg)    ------------------------------------------------------------------------    Prior to Admission medications   Medication Sig Start Date End Date Taking? Authorizing Provider  metFORMIN (GLUCOPHAGE) 500 MG tablet Take 1 tablet (500 mg total) by mouth daily with breakfast. 06/26/15  Yes Jerrol Banana., MD  Multiple Vitamin (MULTIVITAMIN) tablet Take 1 tablet by mouth daily.   Yes Historical Provider, MD    Patient Active Problem List   Diagnosis Date Noted  . 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 Adult BMI 30+ 05/30/2015    History reviewed. No pertinent past medical history.  Social History   Social History  . Marital status: Single    Spouse name: N/A  . Number of children: N/A  . Years of education: N/A   Occupational History  . Not on file.   Social History Main Topics  . Smoking status: Never Smoker  . Smokeless tobacco: Never Used  . Alcohol use No  . Drug use: No    . Sexual activity: Not on file   Other Topics Concern  . Not on file   Social History Narrative  . No narrative on file    No Known Allergies  Review of Systems  Constitutional: Negative.   HENT: Negative.   Eyes: Negative.   Respiratory: Negative.   Cardiovascular: Negative.   Gastrointestinal: Negative.   Genitourinary: Negative.   Musculoskeletal: Negative.   Skin: Negative.   Neurological: Negative.   Endo/Heme/Allergies: Negative.   Psychiatric/Behavioral: Negative.     Immunization History  Administered Date(s) Administered  . Tdap 01/25/2013   Objective:  BP 110/68 (BP Location: Left Arm, Patient Position: Sitting, Cuff Size: Large)   Pulse 80   Temp 98 F (36.7 C) (Oral)   Resp 16   Wt 261 lb (118.4 kg)   BMI 35.40 kg/m   Physical Exam  Constitutional: He is oriented to person, place, and time and well-developed, well-nourished, and in no distress.  Moderately obese white male in no acute distress.  HENT:  Head: Normocephalic and atraumatic.  Right Ear: External ear normal.  Left Ear: External ear normal.  Nose: Nose normal.  Eyes: Conjunctivae are normal.  Cardiovascular: Normal rate, regular rhythm and normal heart sounds.   Pulmonary/Chest: Effort normal and breath sounds normal.  Neurological: He is alert and oriented to person, place, and time.  Skin: Skin is warm and dry.  Psychiatric: Mood, memory, affect and judgment normal.  Lab Results  Component Value Date   WBC 6.5 06/06/2015   HGB 15.2 01/31/2014   HCT 46.0 06/06/2015   PLT 286 06/06/2015   GLUCOSE 105 (H) 10/29/2015   CHOL 198 06/06/2015   TRIG 171 (H) 06/06/2015   HDL 47 06/06/2015   LDLCALC 117 (H) 06/06/2015   TSH 1.660 06/06/2015   HGBA1C 7.3 (H) 10/29/2015    CMP     Component Value Date/Time   NA 142 10/29/2015 1031   K 4.4 10/29/2015 1031   CL 101 10/29/2015 1031   CO2 27 10/29/2015 1031   GLUCOSE 105 (H) 10/29/2015 1031   BUN 14 10/29/2015 1031    CREATININE 1.11 10/29/2015 1031   CALCIUM 9.6 10/29/2015 1031   PROT 7.0 10/29/2015 1031   ALBUMIN 4.4 10/29/2015 1031   AST 24 10/29/2015 1031   ALT 24 10/29/2015 1031   ALKPHOS 96 10/29/2015 1031   BILITOT 0.7 10/29/2015 1031   GFRNONAA 84 10/29/2015 1031   GFRAA 98 10/29/2015 1031    Assessment and Plan :  1. Type 2 diabetes mellitus without complication, without long-term current use of insulin (HCC) Due Micro albumin being elevated at 52 will start Losartan 25 mg daily for kidney protection and follow up 1-2 months. Diet and exercise stressed.  - POCT HgB A1C 7.0 today.  - metFORMIN (GLUCOPHAGE) 1000 MG tablet; Take 1 tablet (1,000 mg total) by mouth daily with supper.  Dispense: 30 tablet; Refill: 12 - POCT UA - Microalbumin - POCT HgB A1C - losartan (COZAAR) 25 MG tablet; Take 1 tablet (25 mg total) by mouth daily.  Dispense: 30 tablet; Refill: 12 2. Diabetic nephropathy/positive microalbumin Blood pressure is good but will start losartan 25 mg daily. 3. Obesity Weight loss through diet and exercise stressed. Patient was seen and examined by Dr. Miguel Aschoff, and noted scribed by Webb Laws, CMA I have done the exam and reviewed the above chart and it is accurate to the best of my knowledge.  Miguel Aschoff MD [facility] 03/03/2016 10:01 AM

## 2016-04-10 ENCOUNTER — Ambulatory Visit (INDEPENDENT_AMBULATORY_CARE_PROVIDER_SITE_OTHER): Payer: BC Managed Care – PPO | Admitting: Family Medicine

## 2016-04-10 VITALS — BP 114/76 | HR 76 | Temp 98.0°F | Resp 18 | Wt 266.0 lb

## 2016-04-10 DIAGNOSIS — I10 Essential (primary) hypertension: Secondary | ICD-10-CM | POA: Diagnosis not present

## 2016-04-10 DIAGNOSIS — E6609 Other obesity due to excess calories: Secondary | ICD-10-CM | POA: Diagnosis not present

## 2016-04-10 NOTE — Progress Notes (Signed)
Connor Berger  MRN: [id number] DOB: [birth year]  Subjective:  HPI   The patient is a 40 year old male who presents for follow up after starting on Losartan.  The patient was last seen on 03/03/16 and it was noted that his urine microalbumin was elevated.  He was therefor started on Losartan for kidney protection.  The patient states he is compliant with the medication until 3 days ago when he finished the original prescription and decided to wait until after he was seen today to refill the medication in the even it needed to be changed.  The patient does complain that since starting the medicine he has noted that he has been having more headache.  The patient states the intensity of the headaches has not increased but the frequency of the episode has increased.  He is now having headaches every 2-3 days.  He states that he has also noticed that since starting the medicine he gets sleepy m ore often.  If he sits for a period of time he feels he is going to fall asleep.  Patient Active Problem List   Diagnosis Date Noted  . 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 Adult BMI 30+ 05/30/2015    No past medical history on file.  Social History   Social History  . Marital status: Single    Spouse name: N/A  . Number of children: N/A  . Years of education: N/A   Occupational History  . Not on file.   Social History Main Topics  . Smoking status: Never Smoker  . Smokeless tobacco: Never Used  . Alcohol use No  . Drug use: No  . Sexual activity: Not on file   Other Topics Concern  . Not on file   Social History Narrative  . No narrative on file    Outpatient Encounter Prescriptions as of 04/10/2016  Medication Sig  . losartan (COZAAR) 25 MG tablet Take 1 tablet (25 mg total) by mouth daily.  . metFORMIN (GLUCOPHAGE) 1000 MG tablet Take 1 tablet (1,000 mg total) by mouth daily with supper.  . Multiple Vitamin  (MULTIVITAMIN) tablet Take 1 tablet by mouth daily.   No facility-administered encounter medications on file as of 04/10/2016.     No Known Allergies  Review of Systems  Constitutional: Negative for fever and malaise/fatigue.  Eyes: Negative.   Respiratory: Negative for cough, shortness of breath and wheezing.   Cardiovascular: Negative for chest pain, palpitations, orthopnea and leg swelling.  Gastrointestinal: Negative.   Neurological: Positive for headaches. Negative for dizziness and weakness.  Endo/Heme/Allergies: Negative.   Psychiatric/Behavioral: Negative.    Objective:  BP 114/76   Pulse 76   Temp 98 F (36.7 C) (Oral)   Resp 18   Wt 266 lb (120.7 kg)   BMI 36.08 kg/m   Physical Exam  Constitutional: He is well-developed, well-nourished, and in no distress.  HENT:  Head: Normocephalic and atraumatic.  Right Ear: External ear normal.  Left Ear: External ear normal.  Nose: Nose normal.  Eyes: Conjunctivae are normal. Pupils are equal, round, and reactive to light.  Neck: Normal range of motion.  Cardiovascular: Normal rate, regular rhythm and normal heart sounds.   Pulmonary/Chest: Effort normal and breath sounds normal.  Abdominal: Soft.  Skin: Skin is warm and dry.  Psychiatric: Mood, memory, affect and judgment normal.    Assessment and Plan :  HTN Obesity Prediabetes RTC 6 months I have  done the exam and reviewed the chart and it is accurate to the best of my knowledge. Miguel Aschoff M.D. [facility]      HPI, Exam and A&P Transcribed under the direction and in the presence of Wilhemena Durie., MD. Electronically Signed: Althea Charon, [place]

## 2016-05-06 ENCOUNTER — Encounter: Payer: Self-pay | Admitting: Physician Assistant

## 2016-05-06 ENCOUNTER — Ambulatory Visit (INDEPENDENT_AMBULATORY_CARE_PROVIDER_SITE_OTHER): Payer: BC Managed Care – PPO | Admitting: Physician Assistant

## 2016-05-06 VITALS — BP 112/80 | HR 84 | Temp 98.6°F | Resp 16 | Wt 263.0 lb

## 2016-05-06 DIAGNOSIS — J020 Streptococcal pharyngitis: Secondary | ICD-10-CM

## 2016-05-06 LAB — POCT RAPID STREP A (OFFICE): Rapid Strep A Screen: POSITIVE — AB

## 2016-05-06 MED ORDER — AMOXICILLIN 500 MG PO CAPS: 500.0000 mg | ORAL_CAPSULE | Freq: Two times a day (BID) | ORAL | 0 refills | Status: AC

## 2016-05-06 NOTE — Progress Notes (Signed)
Patient: Connor Berger Male    DOB: 01/18/1976   39 y.o.   MRN: [id number] Visit Date: 05/06/2016  Today's Provider: Trinna Post, PA-C   Chief Complaint  Patient presents with  . Sore Throat    Started Sunday   Subjective:    Sore Throat   This is a new problem. The current episode started in the past 7 days. The problem has been gradually worsening. Neither side of throat is experiencing more pain than the other. There has been no fever. Associated symptoms include coughing, headaches and trouble swallowing. Pertinent negatives include no congestion, ear discharge, ear pain, shortness of breath or stridor.       No Known Allergies   Current Outpatient Prescriptions:  .  metFORMIN (GLUCOPHAGE) 1000 MG tablet, Take 1 tablet (1,000 mg total) by mouth daily with supper., Disp: 30 tablet, Rfl: 12 .  Multiple Vitamin (MULTIVITAMIN) tablet, Take 1 tablet by mouth daily., Disp: , Rfl:   Review of Systems  Constitutional: Positive for fatigue. Negative for activity change, appetite change, chills, diaphoresis, fever and unexpected weight change.  HENT: Positive for sore throat and trouble swallowing. Negative for congestion, ear discharge, ear pain, postnasal drip, rhinorrhea, sinus pressure, sneezing and tinnitus.   Eyes: Negative.   Respiratory: Positive for cough. Negative for apnea, choking, chest tightness, shortness of breath, wheezing and stridor.   Gastrointestinal: Negative.   Neurological: Positive for headaches.    Social History  Substance Use Topics  . Smoking status: Never Smoker  . Smokeless tobacco: Never Used  . Alcohol use No   Objective:   BP 112/80 (BP Location: Left Arm, Patient Position: Sitting, Cuff Size: Large)   Pulse 84   Temp 98.6 F (37 C) (Oral)   Resp 16   Wt 263 lb (119.3 kg)   BMI 35.67 kg/m   Physical Exam  Constitutional: He is oriented to person, place, and time. He appears well-developed and well-nourished. No distress.   HENT:  Right Ear: External ear normal.  Left Ear: External ear normal.  Nose: Nose normal.  Mouth/Throat: Uvula is midline and mucous membranes are normal. Oropharyngeal exudate, posterior oropharyngeal edema and posterior oropharyngeal erythema present.  Eyes: Conjunctivae are normal.  Cardiovascular: Normal rate and regular rhythm.   Pulmonary/Chest: Effort normal and breath sounds normal. No respiratory distress. He has no wheezes.  Lymphadenopathy:    He has cervical adenopathy.  Neurological: He is alert and oriented to person, place, and time.  Skin: Skin is warm and dry.  Psychiatric: He has a normal mood and affect. His behavior is normal.  Vitals reviewed.       Assessment & Plan:      Problem List Items Addressed This Visit    None    Visit Diagnoses    Pharyngitis due to Streptococcus species    -  Primary   Relevant Medications   amoxicillin (AMOXIL) 500 MG capsule   Other Relevant Orders   POCT rapid strep A (Completed)     Patient is 40 y.o male presenting today with strep pharyngitis. Will treat as above. No PCN allergies. Call back if not feeling better. Contagious until 24 hours on abx. Work note provided.  Return if symptoms worsen or fail to improve.   Patient Instructions  Strep Throat Strep throat is a bacterial infection of the throat. Your health care provider may call the infection tonsillitis or pharyngitis, depending on whether there is swelling in the tonsils or  at the back of the throat. Strep throat is most common during the cold months of the year in children who are 77-45 years of age, but it can happen during any season in people of any age. This infection is spread from person to person (contagious) through coughing, sneezing, or close contact. CAUSES Strep throat is caused by the bacteria called Streptococcus pyogenes. RISK FACTORS This condition is more likely to develop in:  People who spend time in crowded places where the infection  can spread easily.  People who have close contact with someone who has strep throat. SYMPTOMS Symptoms of this condition include:  Fever or chills.   Redness, swelling, or pain in the tonsils or throat.  Pain or difficulty when swallowing.  White or yellow spots on the tonsils or throat.  Swollen, tender glands in the neck or under the jaw.  Red rash all over the body (rare). DIAGNOSIS This condition is diagnosed by performing a rapid strep test or by taking a swab of your throat (throat culture test). Results from a rapid strep test are usually ready in a few minutes, but throat culture test results are available after one or two days. TREATMENT This condition is treated with antibiotic medicine. HOME CARE INSTRUCTIONS Medicines  Take over-the-counter and prescription medicines only as told by your health care provider.  Take your antibiotic as told by your health care provider. Do not stop taking the antibiotic even if you start to feel better.  Have family members who also have a sore throat or fever tested for strep throat. They may need antibiotics if they have the strep infection. Eating and Drinking  Do not share food, drinking cups, or personal items that could cause the infection to spread to other people.  If swallowing is difficult, try eating soft foods until your sore throat feels better.  Drink enough fluid to keep your urine clear or pale yellow. General Instructions  Gargle with a salt-water mixture 3-4 times per day or as needed. To make a salt-water mixture, completely dissolve -1 tsp of salt in 1 cup of warm water.  Make sure that all household members wash their hands well.  Get plenty of rest.  Stay home from school or work until you have been taking antibiotics for 24 hours.  Keep all follow-up visits as told by your health care provider. This is important. SEEK MEDICAL CARE IF:  The glands in your neck continue to get bigger.  You develop a  rash, cough, or earache.  You cough up a thick liquid that is green, yellow-brown, or bloody.  You have pain or discomfort that does not get better with medicine.  Your problems seem to be getting worse rather than better.  You have a fever. SEEK IMMEDIATE MEDICAL CARE IF:  You have new symptoms, such as vomiting, severe headache, stiff or painful neck, chest pain, or shortness of breath.  You have severe throat pain, drooling, or changes in your voice.  You have swelling of the neck, or the skin on the neck becomes red and tender.  You have signs of dehydration, such as fatigue, dry mouth, and decreased urination.  You become increasingly sleepy, or you cannot wake up completely.  Your joints become red or painful.   This information is not intended to replace advice given to you by your health care provider. Make sure you discuss any questions you have with your health care provider.   Document Released: 06/20/2000 Document Revised: 03/14/2015 Document  Reviewed: 10/16/2014 Elsevier Interactive Patient Education [employer].    The entirety of the information documented in the History of Present Illness, Review of Systems and Physical Exam were personally obtained by me. Portions of this information were initially documented by Ashley Royalty, CMA and reviewed by me for thoroughness and accuracy.        Trinna Post, PA-C  [facility]

## 2016-05-06 NOTE — Patient Instructions (Signed)

## 2016-05-08 ENCOUNTER — Ambulatory Visit: Payer: Self-pay | Admitting: Family Medicine

## 2016-07-14 ENCOUNTER — Ambulatory Visit: Payer: BC Managed Care – PPO | Admitting: Family Medicine

## 2016-08-14 ENCOUNTER — Ambulatory Visit (INDEPENDENT_AMBULATORY_CARE_PROVIDER_SITE_OTHER): Payer: BC Managed Care – PPO | Admitting: Family Medicine

## 2016-08-14 VITALS — BP 122/74 | HR 80 | Temp 98.4°F | Resp 14 | Wt 263.0 lb

## 2016-08-14 DIAGNOSIS — E119 Type 2 diabetes mellitus without complications: Secondary | ICD-10-CM | POA: Diagnosis not present

## 2016-08-14 DIAGNOSIS — E6609 Other obesity due to excess calories: Secondary | ICD-10-CM

## 2016-08-14 DIAGNOSIS — I10 Essential (primary) hypertension: Secondary | ICD-10-CM | POA: Diagnosis not present

## 2016-08-14 LAB — POCT GLYCOSYLATED HEMOGLOBIN (HGB A1C): HEMOGLOBIN A1C: 6.6

## 2016-08-14 NOTE — Progress Notes (Signed)
   Connor Berger  MRN: [id number] DOB: 12-04-1975  Subjective:  HPI  Patient is here for follow up, Last office visit was on 04/11/16. BP Readings from Last 3 Encounters:  08/14/16 122/74  05/06/16 112/80  04/10/16 114/76   Patient is checking his sugar in the morning fasting, readings have been around 100-120. No hypoglycemic episodes. No numbness or tingling. Lab Results  Component Value Date   HGBA1C 7.0 03/03/2016   Last labs [facility] 10/29/15, the rest were done on 06/06/15  Patient Active Problem List   Diagnosis Date Noted  . 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 Adult BMI 30+ 05/30/2015    No past medical history on file.  Social History   Social History  . Marital status: Single    Spouse name: N/A  . Number of children: N/A  . Years of education: N/A   Occupational History  . Not on file.   Social History Main Topics  . Smoking status: Never Smoker  . Smokeless tobacco: Never Used  . Alcohol use No  . Drug use: No  . Sexual activity: Not on file   Other Topics Concern  . Not on file   Social History Narrative  . No narrative on file    Outpatient Encounter Prescriptions as of 08/14/2016  Medication Sig  . metFORMIN (GLUCOPHAGE) 1000 MG tablet Take 1 tablet (1,000 mg total) by mouth daily with supper.  . Multiple Vitamin (MULTIVITAMIN) tablet Take 1 tablet by mouth daily.   No facility-administered encounter medications on file as of 08/14/2016.     No Known Allergies  Review of Systems  Constitutional: Negative.   Respiratory: Negative.   Cardiovascular: Negative.   Gastrointestinal: Negative.        Upset stomach at times with eating too much or too fast.  Musculoskeletal: Positive for back pain (due to DDD and seen chiropractor for this.).  Psychiatric/Behavioral: Negative.     Objective:  BP 122/74   Pulse 80   Temp 98.4 F (36.9 C)   Resp 14   Wt 263 lb (119.3 kg)   BMI  35.67 kg/m   Physical Exam  Constitutional: He is oriented to person, place, and time and well-developed, well-nourished, and in no distress.  HENT:  Head: Normocephalic and atraumatic.  Eyes: Conjunctivae are normal. Pupils are equal, round, and reactive to light.  Neck: Normal range of motion. Neck supple.  Cardiovascular: Normal rate, regular rhythm, normal heart sounds and intact distal pulses.   No murmur heard. Pulmonary/Chest: Effort normal and breath sounds normal. No respiratory distress. He has no wheezes.  Neurological: He is alert and oriented to person, place, and time.    Assessment and Plan :  1. Type 2 diabetes mellitus without complication, without long-term current use of insulin (HCC) A1C 6.6. Better. - POCT HgB A1C  2. Essential hypertension  3. Class 1 obesity due to excess calories with serious comorbidity in adult, unspecified BMI  HPI, Exam and A&P transcribed under direction and in the presence of Miguel Aschoff, MD.  I have done the exam and reviewed the chart and it is accurate to the best of my knowledge. Development worker, community has been used and  any errors in dictation or transcription are unintentional. Miguel Aschoff M.D. [facility]

## 2016-09-09 ENCOUNTER — Ambulatory Visit (INDEPENDENT_AMBULATORY_CARE_PROVIDER_SITE_OTHER): Payer: BC Managed Care – PPO | Admitting: Family Medicine

## 2016-09-09 VITALS — BP 122/76 | HR 80 | Temp 98.4°F | Resp 16 | Wt 264.0 lb

## 2016-09-09 DIAGNOSIS — J020 Streptococcal pharyngitis: Secondary | ICD-10-CM

## 2016-09-09 MED ORDER — AMOXICILLIN 500 MG PO CAPS: 500.0000 mg | ORAL_CAPSULE | Freq: Three times a day (TID) | ORAL | 0 refills | Status: DC

## 2016-09-09 NOTE — Progress Notes (Signed)
   Connor Berger  MRN: [id number] DOB: [birth year]  Subjective:  HPI  Patient is here with a complaint of sore throat. Symptoms started yesterday 09/08/16 and got worse over night. Describes sensation as a raw and scratchy feeling. He used chloraseptic spray for his throat and that did help. He has had some headaches and had minor chills yesterday afternoon. No fever or body aches, no drainage or runny nose, no ear pain.  Patient Active Problem List   Diagnosis Date Noted  . 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 Adult BMI 30+ 05/30/2015    No past medical history on file.  Social History   Social History  . Marital status: Single    Spouse name: N/A  . Number of children: N/A  . Years of education: N/A   Occupational History  . Not on file.   Social History Main Topics  . Smoking status: Never Smoker  . Smokeless tobacco: Never Used  . Alcohol use No  . Drug use: No  . Sexual activity: Not on file   Other Topics Concern  . Not on file   Social History Narrative  . No narrative on file    Outpatient Encounter Prescriptions as of 09/09/2016  Medication Sig  . metFORMIN (GLUCOPHAGE) 1000 MG tablet Take 1 tablet (1,000 mg total) by mouth daily with supper.  . Multiple Vitamin (MULTIVITAMIN) tablet Take 1 tablet by mouth daily.   No facility-administered encounter medications on file as of 09/09/2016.     No Known Allergies  Review of Systems  Constitutional: Positive for chills.  HENT: Positive for sore throat. Negative for congestion, ear discharge, ear pain, sinus pain and tinnitus.   Respiratory: Negative.   Cardiovascular: Negative.   Musculoskeletal: Negative.     Objective:  BP 122/76   Pulse 80   Temp 98.4 F (36.9 C)   Resp 16   Wt 264 lb (119.7 kg)   BMI 35.80 kg/m   Physical Exam  Constitutional: He is oriented to person, place, and time and well-developed, well-nourished, and in no  distress.  HENT:  Head: Normocephalic and atraumatic.  Right Ear: External ear normal.  Left Ear: External ear normal.  Mouth/Throat: Oropharyngeal exudate present.  Cardiovascular: Normal rate, regular rhythm, normal heart sounds and intact distal pulses.   No murmur heard. Pulmonary/Chest: Effort normal and breath sounds normal. No respiratory distress. He has no wheezes.  Neurological: He is alert and oriented to person, place, and time.    Assessment and Plan :  1. Strep throat Will treat as strep throat. Start Amoxicillin, rest, gurgle throat, follow as needed.  HPI, Exam and A&P transcribed under direction and in the presence of Miguel Aschoff, MD. I have done the exam and reviewed the chart and it is accurate to the best of my knowledge. Development worker, community has been used and  any errors in dictation or transcription are unintentional. Miguel Aschoff M.D. [facility]

## 2016-12-22 ENCOUNTER — Ambulatory Visit (INDEPENDENT_AMBULATORY_CARE_PROVIDER_SITE_OTHER): Payer: BC Managed Care – PPO | Admitting: Family Medicine

## 2016-12-22 ENCOUNTER — Encounter: Payer: Self-pay | Admitting: Family Medicine

## 2016-12-22 VITALS — BP 112/68 | HR 70 | Temp 97.9°F | Resp 16 | Wt 260.0 lb

## 2016-12-22 DIAGNOSIS — E119 Type 2 diabetes mellitus without complications: Secondary | ICD-10-CM

## 2016-12-22 DIAGNOSIS — I10 Essential (primary) hypertension: Secondary | ICD-10-CM

## 2016-12-22 DIAGNOSIS — E785 Hyperlipidemia, unspecified: Secondary | ICD-10-CM | POA: Diagnosis not present

## 2016-12-22 DIAGNOSIS — J069 Acute upper respiratory infection, unspecified: Secondary | ICD-10-CM | POA: Diagnosis not present

## 2016-12-22 LAB — POCT GLYCOSYLATED HEMOGLOBIN (HGB A1C): Hemoglobin A1C: 6.6

## 2016-12-22 NOTE — Patient Instructions (Signed)
Stay hydrated and rest as your body tells you it needs it. Blood sugar is stable. Continue current medications. Follow up in 4 months.

## 2016-12-22 NOTE — Progress Notes (Signed)
Subjective:  HPI  Diabetes Mellitus Type II, Follow-up:   Lab Results  Component Value Date   HGBA1C 6.6 08/14/2016   HGBA1C 7.0 03/03/2016   HGBA1C 7.3 (H) 10/29/2015    Last seen for diabetes 4 months ago.  Management since then includes none. He reports good compliance with treatment. He is not having side effects.  Home blood sugar records: 110-130  Episodes of hypoglycemia? no   Current Insulin Regimen: n/a Most Recent Eye Exam: 2016, will call and schedule appointment   Current exercise: walking couple times a week.  Pertinent Labs:    Component Value Date/Time   CHOL 198 06/06/2015 1017   TRIG 171 (H) 06/06/2015 1017   HDL 47 06/06/2015 1017   LDLCALC 117 (H) 06/06/2015 1017   CREATININE 1.11 10/29/2015 1031    Wt Readings from Last 3 Encounters:  12/22/16 260 lb (117.9 kg)  09/09/16 264 lb (119.7 kg)  08/14/16 263 lb (119.3 kg)    ------------------------------------------------------------------------ Pt reports that he has been waking up the mornings with a raspy, dry throat and feels like there is drainage.   Prior to Admission medications   Medication Sig Start Date End Date Taking? Authorizing Provider  amoxicillin (AMOXIL) 500 MG capsule Take 1 capsule (500 mg total) by mouth 3 (three) times daily. 09/09/16   Jerrol Banana., MD  metFORMIN (GLUCOPHAGE) 1000 MG tablet Take 1 tablet (1,000 mg total) by mouth daily with supper. 03/03/16   Jerrol Banana., MD  Multiple Vitamin (MULTIVITAMIN) tablet Take 1 tablet by mouth daily.    [provider]    Patient Active Problem List   Diagnosis Date Noted  . 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 Adult BMI 30+ 05/30/2015    History reviewed. No pertinent past medical history.  Social History   Social History  . Marital status: Single    Spouse name: N/A  . Number of children: N/A  . Years of education: N/A    Occupational History  . Not on file.   Social History Main Topics  . Smoking status: Never Smoker  . Smokeless tobacco: Never Used  . Alcohol use No  . Drug use: No  . Sexual activity: Not on file   Other Topics Concern  . Not on file   Social History Narrative  . No narrative on file    No Known Allergies  Review of Systems  Constitutional: Negative.   HENT: Negative.        Dry mouth, post nasal drainage.   Eyes: Negative.   Respiratory: Negative.   Cardiovascular: Negative.   Gastrointestinal: Negative.   Genitourinary: Negative.   Musculoskeletal: Negative.   Skin: Negative.   Neurological: Negative.   Endo/Heme/Allergies: Negative.   Psychiatric/Behavioral: Negative.     Immunization History  Administered Date(s) Administered  . Tdap 01/25/2013    Objective:  BP 112/68 (BP Location: Left Arm, Patient Position: Sitting, Cuff Size: Large)   Pulse 70   Temp 97.9 F (36.6 C) (Oral)   Resp 16   Wt 260 lb (117.9 kg)   BMI 35.26 kg/m   Physical Exam  Constitutional: He is oriented to person, place, and time and well-developed, well-nourished, and in no distress.  HENT:  Head: Normocephalic and atraumatic.  Right Ear: External ear normal.  Left Ear: External ear normal.  Nose: Nose normal.  Mouth/Throat: Oropharynx is clear and moist.  Eyes: Conjunctivae and EOM  are normal. Pupils are equal, round, and reactive to light.  Neck: Normal range of motion. Neck supple.  Cardiovascular: Normal rate, regular rhythm, normal heart sounds and intact distal pulses.   Pulmonary/Chest: Effort normal and breath sounds normal.  Musculoskeletal: Normal range of motion.  Neurological: He is alert and oriented to person, place, and time. He has normal reflexes. Gait normal. GCS score is 15.  Skin: Skin is warm and dry.  Psychiatric: Mood, memory, affect and judgment normal.    Lab Results  Component Value Date   WBC 6.5 06/06/2015   HGB 16.1 06/06/2015   HCT 46.0  06/06/2015   PLT 286 06/06/2015   GLUCOSE 105 (H) 10/29/2015   CHOL 198 06/06/2015   TRIG 171 (H) 06/06/2015   HDL 47 06/06/2015   LDLCALC 117 (H) 06/06/2015   TSH 1.660 06/06/2015   HGBA1C 6.6 08/14/2016   MICROALBUR 50 03/03/2016    CMP     Component Value Date/Time   NA 142 10/29/2015 1031   K 4.4 10/29/2015 1031   CL 101 10/29/2015 1031   CO2 27 10/29/2015 1031   GLUCOSE 105 (H) 10/29/2015 1031   BUN 14 10/29/2015 1031   CREATININE 1.11 10/29/2015 1031   CALCIUM 9.6 10/29/2015 1031   PROT 7.0 10/29/2015 1031   ALBUMIN 4.4 10/29/2015 1031   AST 24 10/29/2015 1031   ALT 24 10/29/2015 1031   ALKPHOS 96 10/29/2015 1031   BILITOT 0.7 10/29/2015 1031   GFRNONAA 84 10/29/2015 1031   GFRAA 98 10/29/2015 1031    Assessment and Plan :  1. Type 2 diabetes mellitus without complication, without long-term current use of insulin (HCC)  - POCT HgB A1C 6.6 today. Continue current medications. Follow up in 4 months.   2. Essential hypertension Stable.  - CBC with Differential/Platelet - TSH   3. Viral upper respiratory tract infection Resolving. Stay hydrated and rest.   4. Hyperlipidemia, unspecified hyperlipidemia type  - Lipid Panel With LDL/HDL Ratio - Comprehensive metabolic panel 5.Obesity  HPI, Exam, and A&P Transcribed under the direction and in the presence of Pixie Burgener L. Cranford Mon, MD  Electronically Signed: Katina Dung, [place] MD [facility] 12/22/2016 9:36 AM

## 2016-12-23 LAB — LIPID PANEL WITH LDL/HDL RATIO
Cholesterol, Total: 164 mg/dL (ref 100–199)
HDL: 56 mg/dL (ref >39)
LDL CALC: 93 mg/dL (ref 0–99)
LDl/HDL Ratio: 1.7 ratio (ref 0.0–3.6)
Triglycerides: 77 mg/dL (ref 0–149)
VLDL Cholesterol Cal: 15 mg/dL (ref 5–40)

## 2016-12-23 LAB — CBC WITH DIFFERENTIAL/PLATELET
BASOS: 1 %
Basophils Absolute: 0 10*3/uL (ref 0.0–0.2)
EOS (ABSOLUTE): 0.3 10*3/uL (ref 0.0–0.4)
EOS: 4 %
HEMATOCRIT: 44.5 % (ref 37.5–51.0)
HEMOGLOBIN: 15.3 g/dL (ref 13.0–17.7)
IMMATURE GRANS (ABS): 0 10*3/uL (ref 0.0–0.1)
Immature Granulocytes: 1 %
LYMPHS: 33 %
Lymphocytes Absolute: 2.5 10*3/uL (ref 0.7–3.1)
MCH: 29.9 pg (ref 26.6–33.0)
MCHC: 34.4 g/dL (ref 31.5–35.7)
MCV: 87 fL (ref 79–97)
MONOCYTES: 6 %
Monocytes Absolute: 0.4 10*3/uL (ref 0.1–0.9)
NEUTROS ABS: 4.2 10*3/uL (ref 1.4–7.0)
Neutrophils: 55 %
Platelets: 291 10*3/uL (ref 150–379)
RBC: 5.12 x10E6/uL (ref 4.14–5.80)
RDW: 14.4 % (ref 12.3–15.4)
WBC: 7.5 10*3/uL (ref 3.4–10.8)

## 2016-12-23 LAB — COMPREHENSIVE METABOLIC PANEL
ALT: 25 IU/L (ref 0–44)
AST: 18 IU/L (ref 0–40)
Albumin/Globulin Ratio: 1.9 (ref 1.2–2.2)
Albumin: 4.5 g/dL (ref 3.5–5.5)
Alkaline Phosphatase: 89 IU/L (ref 39–117)
BUN/Creatinine Ratio: 13 (ref 9–20)
BUN: 14 mg/dL (ref 6–20)
Bilirubin Total: 0.4 mg/dL (ref 0.0–1.2)
CO2: 23 mmol/L (ref 20–29)
Calcium: 9.7 mg/dL (ref 8.7–10.2)
Chloride: 100 mmol/L (ref 96–106)
Creatinine, Ser: 1.1 mg/dL (ref 0.76–1.27)
GFR calc Af Amer: 98 mL/min/{1.73_m2} (ref >59)
GFR calc non Af Amer: 85 mL/min/{1.73_m2} (ref >59)
Globulin, Total: 2.4 g/dL (ref 1.5–4.5)
Glucose: 100 mg/dL — ABNORMAL HIGH (ref 65–99)
Potassium: 4.3 mmol/L (ref 3.5–5.2)
Sodium: 142 mmol/L (ref 134–144)
Total Protein: 6.9 g/dL (ref 6.0–8.5)

## 2016-12-23 LAB — TSH: TSH: 2.42 u[IU]/mL (ref 0.450–4.500)

## 2017-01-30 ENCOUNTER — Telehealth: Payer: Self-pay | Admitting: Family Medicine

## 2017-01-30 NOTE — Telephone Encounter (Signed)
Error/MW °

## 2017-02-20 ENCOUNTER — Telehealth: Payer: Self-pay | Admitting: Family Medicine

## 2017-04-06 ENCOUNTER — Other Ambulatory Visit: Payer: Self-pay | Admitting: Family Medicine

## 2017-04-06 DIAGNOSIS — E119 Type 2 diabetes mellitus without complications: Secondary | ICD-10-CM

## 2017-04-15 ENCOUNTER — Ambulatory Visit: Payer: BC Managed Care – PPO | Admitting: Family Medicine

## 2017-04-16 ENCOUNTER — Encounter: Payer: Self-pay | Admitting: Family Medicine

## 2017-04-16 ENCOUNTER — Ambulatory Visit (INDEPENDENT_AMBULATORY_CARE_PROVIDER_SITE_OTHER): Payer: BC Managed Care – PPO | Admitting: Family Medicine

## 2017-04-16 VITALS — BP 120/74 | HR 74 | Temp 98.0°F | Resp 16 | Wt 249.0 lb

## 2017-04-16 DIAGNOSIS — H60502 Unspecified acute noninfective otitis externa, left ear: Secondary | ICD-10-CM | POA: Diagnosis not present

## 2017-04-16 DIAGNOSIS — Z794 Long term (current) use of insulin: Secondary | ICD-10-CM

## 2017-04-16 DIAGNOSIS — E118 Type 2 diabetes mellitus with unspecified complications: Secondary | ICD-10-CM

## 2017-04-16 LAB — POCT GLYCOSYLATED HEMOGLOBIN (HGB A1C)
ESTIMATED AVERAGE GLUCOSE: 148
HEMOGLOBIN A1C: 6.8

## 2017-04-16 MED ORDER — NEOMYCIN-POLYMYXIN-HC 3.5-10000-1 OT SOLN: 2.0000 [drp] | Freq: Three times a day (TID) | OTIC | 0 refills | Status: DC

## 2017-04-16 NOTE — Progress Notes (Signed)
Patient: Connor Berger Male    DOB: [birth year]   41 y.o.   MRN: [id number] Visit Date: 04/16/2017  Today's Provider: Wilhemena Durie, MD   Chief Complaint  Patient presents with  . Diabetes  . Ear Pain   Subjective:    HPI  Diabetes Mellitus Type II, Follow-up:   Lab Results  Component Value Date   HGBA1C 6.8 04/16/2017   HGBA1C 6.6 12/22/2016   HGBA1C 6.6 08/14/2016    Last seen for diabetes 3 months ago.  Management since then includes no changes. He reports good compliance with treatment. He is not having side effects.  Current symptoms include none and have been stable. Home blood sugar records: fasting range: 110s  Episodes of hypoglycemia? no    Most Recent Eye Exam: up to date Weight trend: stable Prior visit with dietician: no Current diet: well balanced Current exercise: no regular exercise  Pertinent Labs:    Component Value Date/Time   CHOL 164 12/22/2016 1008   TRIG 77 12/22/2016 1008   HDL 56 12/22/2016 1008   LDLCALC 93 12/22/2016 1008   CREATININE 1.10 12/22/2016 1008    Wt Readings from Last 3 Encounters:  04/16/17 249 lb (112.9 kg)  12/22/16 260 lb (117.9 kg)  09/09/16 264 lb (119.7 kg)      Left ear pain: Patient also c/o left ear pain X 1 week. He denies any cold symptoms. He denies any drainage. He states, "I feel like I have something stuck in my ear." He denies any changes in his hearing. His symptoms have been constant.     No Known Allergies   Current Outpatient Prescriptions:  .  glucose blood (FREESTYLE LITE) test strip, Check sugar once daily, DX E11.9, Disp: 100 each, Rfl: 12 .  Lancets (FREESTYLE) lancets, Check sugar once daily DX E11.9, Disp: 100 each, Rfl: 12 .  metFORMIN (GLUCOPHAGE) 1000 MG tablet, take 1 tablet by mouth once daily with dinner, Disp: 30 tablet, Rfl: 12 .  Multiple Vitamin (MULTIVITAMIN) tablet, Take 1 tablet by mouth daily., Disp: , Rfl:   Review of Systems  Constitutional:  Negative.   HENT: Positive for ear pain.   Cardiovascular: Negative for chest pain, palpitations and leg swelling.  Endocrine: Negative.   Musculoskeletal: Negative.   Neurological: Negative.     Social History  Substance Use Topics  . Smoking status: Never Smoker  . Smokeless tobacco: Never Used  . Alcohol use No   Objective:   BP 120/74 (BP Location: Left Arm, Patient Position: Sitting, Cuff Size: Large)   Pulse 74   Temp 98 F (36.7 C)   Resp 16   Wt 249 lb (112.9 kg)   SpO2 96%   BMI 33.77 kg/m  Vitals:   04/16/17 1144  BP: 120/74  Pulse: 74  Resp: 16  Temp: 98 F (36.7 C)  SpO2: 96%  Weight: 249 lb (112.9 kg)     Physical Exam  Constitutional: He is oriented to person, place, and time. He appears well-developed and well-nourished.  HENT:  Head: Normocephalic and atraumatic.  Right Ear: External ear normal.  Left Ear: External ear normal.  Nose: Nose normal.  Mild left otitis externa.  Eyes: Conjunctivae are normal. No scleral icterus.  Neck: No thyromegaly present.  Cardiovascular: Normal rate, regular rhythm and normal heart sounds.   Pulmonary/Chest: Effort normal and breath sounds normal.  Abdominal: Soft.  Neurological: He is alert and oriented to person, place, and time.  Skin: Skin is warm and dry.  Psychiatric: He has a normal mood and affect. His behavior is normal. Judgment and thought content normal.        Assessment & Plan:     1. Type 2 diabetes mellitus with complication, with long-term current use of insulin (HCC)  - POCT glycosylated hemoglobin (Hb A1C)  2. Acute otitis externa of left ear, unspecified type  - neomycin-polymyxin-hydrocortisone (CORTISPORIN) OTIC solution; Place 2 drops into the left ear 3 (three) times daily.  Dispense: 10 mL; Refill: 0       I have done the exam and reviewed the above chart and it is accurate to the best of my knowledge. Development worker, community has been used in this note in any air is in the  dictation or transcription are unintentional.  Wilhemena Durie, MD  [facility]

## 2017-05-06 ENCOUNTER — Ambulatory Visit (INDEPENDENT_AMBULATORY_CARE_PROVIDER_SITE_OTHER): Payer: BC Managed Care – PPO | Admitting: Family Medicine

## 2017-05-06 VITALS — BP 116/78 | HR 84 | Temp 98.4°F | Resp 16 | Wt 249.0 lb

## 2017-05-06 DIAGNOSIS — J069 Acute upper respiratory infection, unspecified: Secondary | ICD-10-CM | POA: Diagnosis not present

## 2017-05-06 LAB — POCT RAPID STREP A (OFFICE): RAPID STREP A SCREEN: NEGATIVE

## 2017-05-06 NOTE — Progress Notes (Signed)
Subjective:     Patient ID: Corrinne Eagle, male   DOB: 18-Mar-1976, 41 y.o.   MRN: [id number]  HPI  Chief Complaint  Patient presents with  . Sore Throat    Sx started 2 days ago. He is c/o sore throat, some dysphagia (due to swollen glands), sinus H/A, chills, some productive cough. He states he has not checked his temperature. He has not had a flu shot. He has tried Chloraseptic spray, without relief.  Denies significant sinus congestion.   Review of Systems     Objective:   Physical Exam  Constitutional: He appears well-developed and well-nourished. No distress.  Ears: T.M's intact without inflammation Throat: mild tonsillar enlargement and erythema Neck: no cervical adenopathy Lungs: clear     Assessment:    1. Viral upper respiratory tract infection - POCT rapid strep A    Plan:    Discussed use of Mucinex D for congestion, Delsym for cough, and Benadryl for postnasal drainage

## 2017-05-06 NOTE — Patient Instructions (Signed)
Discussed use of Mucinex D for congestion, Delsym for cough, and Benadryl for postnasal drainage 

## 2017-08-19 ENCOUNTER — Ambulatory Visit: Payer: BC Managed Care – PPO | Admitting: Family Medicine

## 2017-08-20 ENCOUNTER — Encounter: Payer: Self-pay | Admitting: Family Medicine

## 2017-08-20 ENCOUNTER — Ambulatory Visit: Payer: BC Managed Care – PPO | Admitting: Family Medicine

## 2017-08-20 VITALS — BP 122/68 | HR 80 | Temp 97.8°F | Resp 16 | Ht 72.0 in | Wt 240.0 lb

## 2017-08-20 DIAGNOSIS — E785 Hyperlipidemia, unspecified: Secondary | ICD-10-CM

## 2017-08-20 DIAGNOSIS — E119 Type 2 diabetes mellitus without complications: Secondary | ICD-10-CM

## 2017-08-20 LAB — POCT GLYCOSYLATED HEMOGLOBIN (HGB A1C)
ESTIMATED AVERAGE GLUCOSE: 140
Hemoglobin A1C: 7.2

## 2017-08-20 MED ORDER — ROSUVASTATIN CALCIUM 10 MG PO TABS
10.0000 mg | ORAL_TABLET | Freq: Every day | ORAL | 5 refills | Status: DC
Start: 2017-08-20 — End: 2018-03-15

## 2017-08-20 NOTE — Progress Notes (Signed)
Patient: Connor Berger Male    DOB: 18-Jun-1976   42 y.o.   MRN: [id number] Visit Date: 08/20/2017  Today's Provider: Wilhemena Durie, MD   Chief Complaint  Patient presents with  . Diabetes   Subjective:    HPI  Diabetes Mellitus Type II, Follow-up:   Lab Results  Component Value Date   HGBA1C 7.2 08/20/2017   HGBA1C 6.8 04/16/2017   HGBA1C 6.6 12/22/2016    Last seen for diabetes 4 months ago.  Management since then includes no changes. He reports good compliance with treatment. He is not having side effects.  Current symptoms include none and have been stable. Home blood sugar records: fasting range: 70s  Episodes of hypoglycemia? no   Current Insulin Regimen: none Most Recent Eye Exam: up to date Weight trend: stable Prior visit with dietician: no Current diet: well balanced Current exercise: walking  Pertinent Labs:    Component Value Date/Time   CHOL 164 12/22/2016 1008   TRIG 77 12/22/2016 1008   HDL 56 12/22/2016 1008   LDLCALC 93 12/22/2016 1008   CREATININE 1.10 12/22/2016 1008    Wt Readings from Last 3 Encounters:  08/20/17 240 lb (108.9 kg)  05/06/17 249 lb (112.9 kg)  04/16/17 249 lb (112.9 kg)       No Known Allergies   Current Outpatient Medications:  .  glucose blood (FREESTYLE LITE) test strip, Check sugar once daily, DX E11.9, Disp: 100 each, Rfl: 12 .  Lancets (FREESTYLE) lancets, Check sugar once daily DX E11.9, Disp: 100 each, Rfl: 12 .  metFORMIN (GLUCOPHAGE) 1000 MG tablet, take 1 tablet by mouth once daily with dinner, Disp: 30 tablet, Rfl: 12 .  Multiple Vitamin (MULTIVITAMIN) tablet, Take 1 tablet by mouth daily., Disp: , Rfl:  .  neomycin-polymyxin-hydrocortisone (CORTISPORIN) OTIC solution, Place 2 drops into the left ear 3 (three) times daily., Disp: 10 mL, Rfl: 0  Review of Systems  Constitutional: Negative.   Endocrine: Negative.   Musculoskeletal: Negative.   Neurological: Negative.     Psychiatric/Behavioral: Negative.     Social History   Tobacco Use  . Smoking status: Never Smoker  . Smokeless tobacco: Never Used  Substance Use Topics  . Alcohol use: No   Objective:   BP 122/68 (BP Location: Right Arm, Patient Position: Sitting, Cuff Size: Large)   Pulse 80   Temp 97.8 F (36.6 C)   Resp 16   Ht 6' (1.829 m)   Wt 240 lb (108.9 kg)   BMI 32.55 kg/m  Vitals:   08/20/17 1349  BP: 122/68  Pulse: 80  Resp: 16  Temp: 97.8 F (36.6 C)  Weight: 240 lb (108.9 kg)  Height: 6' (1.829 m)     Physical Exam  Constitutional: He is oriented to person, place, and time. He appears well-developed and well-nourished.  HENT:  Head: Normocephalic and atraumatic.  Eyes: Conjunctivae are normal. No scleral icterus.  Neck: No thyromegaly present.  Cardiovascular: Normal rate, regular rhythm and normal heart sounds.  Pulmonary/Chest: Effort normal and breath sounds normal.  Abdominal: Soft.  Neurological: He is alert and oriented to person, place, and time.  Skin: Skin is warm and dry.  Psychiatric: He has a normal mood and affect. His behavior is normal. Judgment and thought content normal.        Assessment & Plan:     1. Type 2 diabetes mellitus without complication, without long-term current use of insulin (HCC) RTC 3-4 months. -  POCT glycosylated hemoglobin (Hb A1C)--6.9  2. Hyperlipidemia, unspecified hyperlipidemia type  - rosuvastatin (CRESTOR) 10 MG tablet; Take 1 tablet (10 mg total) by mouth daily.  Dispense: 30 tablet; Refill: 5 3.Obesity Pt has lost 9 lbs since last OV.  I have done the exam and reviewed the chart and it is accurate to the best of my knowledge. Development worker, community has been used and  any errors in dictation or transcription are unintentional. Miguel Aschoff M.D. [place], MD  [facility]

## 2017-12-23 ENCOUNTER — Encounter: Payer: Self-pay | Admitting: Family Medicine

## 2017-12-23 ENCOUNTER — Ambulatory Visit: Payer: BC Managed Care – PPO | Admitting: Family Medicine

## 2017-12-23 VITALS — BP 126/76 | HR 64 | Temp 98.6°F | Resp 16 | Ht 72.0 in | Wt 236.0 lb

## 2017-12-23 DIAGNOSIS — E119 Type 2 diabetes mellitus without complications: Secondary | ICD-10-CM | POA: Diagnosis not present

## 2017-12-23 DIAGNOSIS — E785 Hyperlipidemia, unspecified: Secondary | ICD-10-CM

## 2017-12-23 LAB — POCT GLYCOSYLATED HEMOGLOBIN (HGB A1C)
Est. average glucose Bld gHb Est-mCnc: 154
HEMOGLOBIN A1C: 7 % — AB (ref 4.0–5.6)

## 2017-12-23 NOTE — Progress Notes (Signed)
Patient: Connor Berger Male    DOB: May 04, 1976   42 y.o.   MRN: [id number] Visit Date: 12/23/2017  Today's Provider: Wilhemena Durie, MD   Chief Complaint  Patient presents with  . Diabetes  . Hyperlipidemia   Subjective:    HPI  Diabetes Mellitus Type II, Follow-up:   Lab Results  Component Value Date   HGBA1C 7.0 (A) 12/23/2017   HGBA1C 7.2 08/20/2017   HGBA1C 6.8 04/16/2017    Last seen for diabetes 4 months ago.  Management since then includes no changes. He reports good compliance with treatment. He is not having side effects.  Current symptoms include none and have been stable. Home blood sugar records: trend: stable  Episodes of hypoglycemia? no   Current Insulin Regimen: none Most Recent Eye Exam: due  Weight trend: stable Prior visit with dietician: no Current diet: well balanced Current exercise: no regular exercise  Pertinent Labs:    Component Value Date/Time   CHOL 164 12/22/2016 1008   TRIG 77 12/22/2016 1008   HDL 56 12/22/2016 1008   LDLCALC 93 12/22/2016 1008   CREATININE 1.10 12/22/2016 1008    Wt Readings from Last 3 Encounters:  12/23/17 236 lb (107 kg)  08/20/17 240 lb (108.9 kg)  05/06/17 249 lb (112.9 kg)    Lipid/Cholesterol, Follow-up:   Last seen for this4 months ago.  Management changes since that visit include no change. . Last Lipid Panel:    Component Value Date/Time   CHOL 164 12/22/2016 1008   TRIG 77 12/22/2016 1008   HDL 56 12/22/2016 1008   LDLCALC 93 12/22/2016 1008    Risk factors for vascular disease include diabetes mellitus  He reports good compliance with treatment. He is not having side effects.  Current symptoms include none and have been unchanged. Weight trend: stable Prior visit with dietician: no Current diet: well balanced Current exercise: none  Wt Readings from Last 3 Encounters:  12/23/17 236 lb (107 kg)  08/20/17 240 lb (108.9 kg)  05/06/17 249 lb (112.9 kg)      No  Known Allergies   Current Outpatient Medications:  .  glucose blood (FREESTYLE LITE) test strip, Check sugar once daily, DX E11.9, Disp: 100 each, Rfl: 12 .  Lancets (FREESTYLE) lancets, Check sugar once daily DX E11.9, Disp: 100 each, Rfl: 12 .  metFORMIN (GLUCOPHAGE) 1000 MG tablet, take 1 tablet by mouth once daily with dinner, Disp: 30 tablet, Rfl: 12 .  Multiple Vitamin (MULTIVITAMIN) tablet, Take 1 tablet by mouth daily., Disp: , Rfl:  .  neomycin-polymyxin-hydrocortisone (CORTISPORIN) OTIC solution, Place 2 drops into the left ear 3 (three) times daily., Disp: 10 mL, Rfl: 0 .  rosuvastatin (CRESTOR) 10 MG tablet, Take 1 tablet (10 mg total) by mouth daily., Disp: 30 tablet, Rfl: 5  Review of Systems  Constitutional: Negative.   Respiratory: Negative.   Cardiovascular: Negative.   Endocrine: Negative.   Allergic/Immunologic: Negative.   Neurological: Negative.   Psychiatric/Behavioral: Negative.     Social History   Tobacco Use  . Smoking status: Never Smoker  . Smokeless tobacco: Never Used  Substance Use Topics  . Alcohol use: No   Objective:   BP 126/76 (BP Location: Right Arm, Patient Position: Sitting, Cuff Size: Normal)   Pulse 64   Temp 98.6 F (37 C)   Resp 16   Ht 6' (1.829 m)   Wt 236 lb (107 kg)   SpO2 98%   BMI  32.01 kg/m    Physical Exam  Constitutional: He is oriented to person, place, and time. He appears well-developed and well-nourished.  HENT:  Head: Normocephalic and atraumatic.  Right Ear: External ear normal.  Left Ear: External ear normal.  Nose: Nose normal.  Eyes: Conjunctivae are normal. No scleral icterus.  Neck: No thyromegaly present.  Cardiovascular: Normal rate, regular rhythm and normal heart sounds.  Pulmonary/Chest: Effort normal and breath sounds normal.  Abdominal: Soft.  Musculoskeletal: He exhibits no edema.  Neurological: He is alert and oriented to person, place, and time.  Monofilament foot exam normal.  Skin: Skin  is warm and dry.  Psychiatric: He has a normal mood and affect. His behavior is normal. Judgment and thought content normal.        Assessment & Plan:     1. Type 2 diabetes mellitus without complication, without long-term current use of insulin (HCC)  - POCT glycosylated hemoglobin (Hb A1C) - CBC with Differential/Platelet  2. Hyperlipidemia, unspecified hyperlipidemia type  - Comprehensive metabolic panel - Lipid panel - TSH       Richard Cranford Mon, MD  [facility]

## 2018-03-15 ENCOUNTER — Other Ambulatory Visit: Payer: Self-pay | Admitting: Family Medicine

## 2018-03-15 DIAGNOSIS — E785 Hyperlipidemia, unspecified: Secondary | ICD-10-CM

## 2018-05-03 ENCOUNTER — Other Ambulatory Visit: Payer: Self-pay | Admitting: Family Medicine

## 2018-05-03 DIAGNOSIS — E119 Type 2 diabetes mellitus without complications: Secondary | ICD-10-CM

## 2018-05-03 NOTE — Telephone Encounter (Signed)
Medication was sent in this morning.

## 2018-05-03 NOTE — Telephone Encounter (Signed)
Pt needing a refill on:  metFORMIN (GLUCOPHAGE) 1000 MG tablet  Please fill at:  [facility] [place], [place] [phone] (Phone) [phone] (Fax)    Thanks, [employer]

## 2018-05-25 ENCOUNTER — Ambulatory Visit: Payer: BC Managed Care – PPO | Admitting: Family Medicine

## 2018-05-25 ENCOUNTER — Encounter: Payer: Self-pay | Admitting: Family Medicine

## 2018-05-25 VITALS — BP 128/83 | HR 69 | Resp 16 | Wt 260.0 lb

## 2018-05-25 DIAGNOSIS — E785 Hyperlipidemia, unspecified: Secondary | ICD-10-CM | POA: Diagnosis not present

## 2018-05-25 DIAGNOSIS — I10 Essential (primary) hypertension: Secondary | ICD-10-CM | POA: Diagnosis not present

## 2018-05-25 DIAGNOSIS — E119 Type 2 diabetes mellitus without complications: Secondary | ICD-10-CM

## 2018-05-25 NOTE — Progress Notes (Signed)
Patient: Connor Berger Male    DOB: 12/30/1975   42 y.o.   MRN: [id number] Visit Date: 05/25/2018  Today's Provider: Wilhemena Durie, MD   Chief Complaint  Patient presents with  . Follow-up  . Diabetes  . Hyperlipidemia   Subjective:    HPI   Diabetes Mellitus Type II, Follow-up:   Lab Results  Component Value Date   HGBA1C 7.0 (A) 12/23/2017   HGBA1C 7.2 08/20/2017   HGBA1C 6.8 04/16/2017   Last seen for diabetes 4 months ago.  Management since then includes; no changes . He reports good compliance with treatment. He is not having side effects. none Current symptoms include none and have been unchanged. Home blood sugar records: fasting range: 100  Episodes of hypoglycemia? no   Current Insulin Regimen: none Most Recent Eye Exam: due Weight trend: increasing steadily Prior visit with dietician: no Current diet: well balanced Current exercise: none  ---------------------------------------------------------------   Lipid/Cholesterol, Follow-up:   Last seen for this 4 months ago.  Management since that visit includes; labs ordered, but zero were done.  Last Lipid Panel:    Component Value Date/Time   CHOL 164 12/22/2016 1008   TRIG 77 12/22/2016 1008   HDL 56 12/22/2016 1008   LDLCALC 93 12/22/2016 1008    He reports good compliance with treatment. He is not having side effects. none  Wt Readings from Last 3 Encounters:  05/25/18 260 lb (117.9 kg)  12/23/17 236 lb (107 kg)  08/20/17 240 lb (108.9 kg)    ---------------------------------------------------------------    No Known Allergies   Current Outpatient Medications:  .  glucose blood (FREESTYLE LITE) test strip, Check sugar once daily, DX E11.9, Disp: 100 each, Rfl: 12 .  Lancets (FREESTYLE) lancets, Check sugar once daily DX E11.9, Disp: 100 each, Rfl: 12 .  metFORMIN (GLUCOPHAGE) 1000 MG tablet, TAKE 1 TABLET BY MOUTH ONCE DAILY WITH DINNER., Disp: 30 tablet, Rfl: 12 .   Multiple Vitamin (MULTIVITAMIN) tablet, Take 1 tablet by mouth daily., Disp: , Rfl:  .  rosuvastatin (CRESTOR) 10 MG tablet, TAKE 1 TABLET BY MOUTH ONCE DAILY., Disp: 30 tablet, Rfl: 0 .  neomycin-polymyxin-hydrocortisone (CORTISPORIN) OTIC solution, Place 2 drops into the left ear 3 (three) times daily. (Patient not taking: Reported on 05/25/2018), Disp: 10 mL, Rfl: 0  Review of Systems  Constitutional: Negative for appetite change, chills and fever.  HENT: Negative.   Eyes: Negative.   Respiratory: Negative for chest tightness, shortness of breath and wheezing.   Cardiovascular: Negative for chest pain and palpitations.  Gastrointestinal: Negative for abdominal pain, nausea and vomiting.  Endocrine: Negative.   Allergic/Immunologic: Negative.   Psychiatric/Behavioral: Negative.     Social History   Tobacco Use  . Smoking status: Never Smoker  . Smokeless tobacco: Never Used  Substance Use Topics  . Alcohol use: No   Objective:   BP 128/83 (BP Location: Right Arm, Patient Position: Sitting, Cuff Size: Large)   Pulse 69   Resp 16   Wt 260 lb (117.9 kg)   SpO2 97%   BMI 35.26 kg/m  Vitals:   05/25/18 1012  BP: 128/83  Pulse: 69  Resp: 16  SpO2: 97%  Weight: 260 lb (117.9 kg)     Physical Exam  Constitutional: He is oriented to person, place, and time. He appears well-developed and well-nourished.  HENT:  Head: Normocephalic and atraumatic.  Right Ear: External ear normal.  Left Ear: External ear normal.  Nose: Nose normal.  Eyes: Conjunctivae are normal. No scleral icterus.  Cardiovascular: Normal rate, regular rhythm and normal heart sounds.  Pulmonary/Chest: Effort normal and breath sounds normal.  Abdominal: Soft.  Musculoskeletal: He exhibits no edema.  Neurological: He is alert and oriented to person, place, and time.  Skin: Skin is warm and dry.  Psychiatric: He has a normal mood and affect. His behavior is normal. Judgment and thought content normal.         Assessment & Plan:     1. Type 2 diabetes mellitus without complication, without long-term current use of insulin (HCC)  - Hemoglobin A1C - TSH - CBC w/Diff/Platelet - Comprehensive Metabolic Panel (CMET)  2. Hyperlipidemia, unspecified hyperlipidemia type  - Lipid panel - TSH - CBC w/Diff/Platelet - Comprehensive Metabolic Panel (CMET)  3. Essential hypertension  - TSH - CBC w/Diff/Platelet - Comprehensive Metabolic Panel (CMET) 4.  Morbid obesity with BMI up to 35 Again discussed the need for dietary and exercise changes.     I have done the exam and reviewed the above chart and it is accurate to the best of my knowledge. Development worker, community has been used in this note in any air is in the dictation or transcription are unintentional.  Wilhemena Durie, MD  [place]

## 2018-05-25 NOTE — Patient Instructions (Addendum)
Type 2 diabetes mellitus without complication  Hemoglobin A1c ordered  Hyperlipidemia  Lipid panel TSH CBC MetC  Follow-up office visit in 4 months

## 2018-05-26 LAB — CBC WITH DIFFERENTIAL/PLATELET
Basophils Absolute: 0.1 10*3/uL (ref 0.0–0.2)
Basos: 1 %
EOS (ABSOLUTE): 0.5 10*3/uL — ABNORMAL HIGH (ref 0.0–0.4)
EOS: 6 %
HEMATOCRIT: 45.4 % (ref 37.5–51.0)
Hemoglobin: 15.5 g/dL (ref 13.0–17.7)
IMMATURE GRANS (ABS): 0.1 10*3/uL (ref 0.0–0.1)
IMMATURE GRANULOCYTES: 1 %
LYMPHS: 30 %
Lymphocytes Absolute: 2.5 10*3/uL (ref 0.7–3.1)
MCH: 29.9 pg (ref 26.6–33.0)
MCHC: 34.1 g/dL (ref 31.5–35.7)
MCV: 88 fL (ref 79–97)
MONOCYTES: 8 %
MONOS ABS: 0.6 10*3/uL (ref 0.1–0.9)
NEUTROS PCT: 54 %
Neutrophils Absolute: 4.5 10*3/uL (ref 1.4–7.0)
Platelets: 276 10*3/uL (ref 150–450)
RBC: 5.18 x10E6/uL (ref 4.14–5.80)
RDW: 12.8 % (ref 12.3–15.4)
WBC: 8.2 10*3/uL (ref 3.4–10.8)

## 2018-05-26 LAB — COMPREHENSIVE METABOLIC PANEL
ALT: 30 IU/L (ref 0–44)
AST: 23 IU/L (ref 0–40)
Albumin/Globulin Ratio: 1.8 (ref 1.2–2.2)
Albumin: 4.4 g/dL (ref 3.5–5.5)
Alkaline Phosphatase: 106 IU/L (ref 39–117)
BUN/Creatinine Ratio: 11 (ref 9–20)
BUN: 12 mg/dL (ref 6–24)
Bilirubin Total: 0.5 mg/dL (ref 0.0–1.2)
CALCIUM: 9.5 mg/dL (ref 8.7–10.2)
CO2: 22 mmol/L (ref 20–29)
CREATININE: 1.08 mg/dL (ref 0.76–1.27)
Chloride: 100 mmol/L (ref 96–106)
GFR calc Af Amer: 99 mL/min/{1.73_m2} (ref >59)
GFR, EST NON AFRICAN AMERICAN: 85 mL/min/{1.73_m2} (ref >59)
GLOBULIN, TOTAL: 2.4 g/dL (ref 1.5–4.5)
Glucose: 105 mg/dL — ABNORMAL HIGH (ref 65–99)
Potassium: 4.3 mmol/L (ref 3.5–5.2)
Sodium: 140 mmol/L (ref 134–144)
TOTAL PROTEIN: 6.8 g/dL (ref 6.0–8.5)

## 2018-05-26 LAB — TSH: TSH: 2.2 u[IU]/mL (ref 0.450–4.500)

## 2018-05-26 LAB — LIPID PANEL
CHOL/HDL RATIO: 2 ratio (ref 0.0–5.0)
CHOLESTEROL TOTAL: 116 mg/dL (ref 100–199)
HDL: 58 mg/dL (ref >39)
LDL Calculated: 43 mg/dL (ref 0–99)
TRIGLYCERIDES: 76 mg/dL (ref 0–149)
VLDL Cholesterol Cal: 15 mg/dL (ref 5–40)

## 2018-05-26 LAB — HEMOGLOBIN A1C
ESTIMATED AVERAGE GLUCOSE: 163 mg/dL
HEMOGLOBIN A1C: 7.3 % — AB (ref 4.8–5.6)

## 2018-05-27 ENCOUNTER — Telehealth: Payer: Self-pay | Admitting: *Deleted

## 2018-05-27 NOTE — Telephone Encounter (Signed)
LMOVM for pt to return call 

## 2018-05-27 NOTE — Telephone Encounter (Signed)
-----   Message from Jerrol Banana., MD sent at 05/27/2018  8:49 AM EST ----- Labs okay

## 2018-05-28 NOTE — Telephone Encounter (Signed)
Left detailed message on pt's vm. Advised to cb  With any questions.

## 2018-07-04 ENCOUNTER — Other Ambulatory Visit: Payer: Self-pay | Admitting: Family Medicine

## 2018-07-04 DIAGNOSIS — E785 Hyperlipidemia, unspecified: Secondary | ICD-10-CM

## 2018-09-23 ENCOUNTER — Ambulatory Visit: Payer: BC Managed Care – PPO | Admitting: Family Medicine

## 2018-09-23 ENCOUNTER — Encounter: Payer: Self-pay | Admitting: Family Medicine

## 2018-09-23 ENCOUNTER — Other Ambulatory Visit: Payer: Self-pay

## 2018-09-23 VITALS — BP 128/88 | HR 71 | Temp 97.7°F | Ht 72.0 in | Wt 263.8 lb

## 2018-09-23 DIAGNOSIS — E119 Type 2 diabetes mellitus without complications: Secondary | ICD-10-CM | POA: Diagnosis not present

## 2018-09-23 DIAGNOSIS — E7849 Other hyperlipidemia: Secondary | ICD-10-CM | POA: Diagnosis not present

## 2018-09-23 LAB — POCT GLYCOSYLATED HEMOGLOBIN (HGB A1C): Hemoglobin A1C: 7.3 % — AB (ref 4.0–5.6)

## 2018-09-23 NOTE — Progress Notes (Signed)
Patient: Connor Berger Male    DOB: 09-Dec-1975   43 y.o.   MRN: [id number] Visit Date: 09/23/2018  Today's Provider: Wilhemena Durie, MD   Chief Complaint  Patient presents with  . Diabetes    4 month fup   Subjective:     HPI   Diabetes Mellitus Type II, Follow-up:   Lab Results  Component Value Date   HGBA1C 7.3 (H) 05/25/2018   HGBA1C 7.0 (A) 12/23/2017   HGBA1C 7.2 08/20/2017    Last seen for diabetes 4 months ago.  Management since then includes no changes He reports good compliance with treatment. He is not having side effects.  Current symptoms include none and have been stable. Home blood sugar records: fasting range: 50 - 150  Episodes of hypoglycemia? no   Current Insulin Regimen: none Most Recent Eye Exam: not since 2018 ? Weight trend: stable Prior visit with dietician: No Current exercise: walking Current diet habits: well balanced  Pertinent Labs:    Component Value Date/Time   CHOL 116 05/25/2018 1107   TRIG 76 05/25/2018 1107   HDL 58 05/25/2018 1107   LDLCALC 43 05/25/2018 1107   CREATININE 1.08 05/25/2018 1107    Wt Readings from Last 3 Encounters:  09/23/18 263 lb 12.8 oz (119.7 kg)  05/25/18 260 lb (117.9 kg)  12/23/17 236 lb (107 kg)    ------------------------------------------------------------------------   No Known Allergies   Current Outpatient Medications:  .  glucose blood (FREESTYLE LITE) test strip, Check sugar once daily, DX E11.9, Disp: 100 each, Rfl: 12 .  Lancets (FREESTYLE) lancets, Check sugar once daily DX E11.9, Disp: 100 each, Rfl: 12 .  metFORMIN (GLUCOPHAGE) 1000 MG tablet, TAKE 1 TABLET BY MOUTH ONCE DAILY WITH DINNER., Disp: 30 tablet, Rfl: 12 .  Multiple Vitamin (MULTIVITAMIN) tablet, Take 1 tablet by mouth daily., Disp: , Rfl:  .  rosuvastatin (CRESTOR) 10 MG tablet, TAKE 1 TABLET ONCE DAILY, Disp: 30 tablet, Rfl: 0  Review of Systems  Constitutional: Negative.   HENT: Negative.   Eyes:  Negative.   Respiratory: Negative.   Cardiovascular: Negative.   Gastrointestinal: Negative.   Endocrine: Negative.   Genitourinary: Negative.   Musculoskeletal: Negative.   Skin: Negative.   Allergic/Immunologic: Negative.   Neurological: Negative.   Hematological: Negative.   Psychiatric/Behavioral: Negative.     Social History   Tobacco Use  . Smoking status: Never Smoker  . Smokeless tobacco: Never Used  Substance Use Topics  . Alcohol use: No      Objective:   BP 128/88 (BP Location: Right Arm, Patient Position: Sitting, Cuff Size: Normal)   Pulse 71   Temp 97.7 F (36.5 C) (Oral)   Ht 6' (1.829 m)   Wt 263 lb 12.8 oz (119.7 kg)   SpO2 94%   BMI 35.78 kg/m  Vitals:   09/23/18 1000  BP: 128/88  Pulse: 71  Temp: 97.7 F (36.5 C)  TempSrc: Oral  SpO2: 94%  Weight: 263 lb 12.8 oz (119.7 kg)  Height: 6' (1.829 m)     Physical Exam Vitals signs reviewed.  Constitutional:      Appearance: He is well-developed.  HENT:     Head: Normocephalic and atraumatic.  Eyes:     General: No scleral icterus.    Conjunctiva/sclera: Conjunctivae normal.  Neck:     Thyroid: No thyromegaly.  Cardiovascular:     Rate and Rhythm: Normal rate and regular rhythm.  Heart sounds: Normal heart sounds.  Pulmonary:     Effort: Pulmonary effort is normal.     Breath sounds: Normal breath sounds.  Abdominal:     Palpations: Abdomen is soft.  Skin:    General: Skin is warm and dry.  Neurological:     Mental Status: He is alert and oriented to person, place, and time.  Psychiatric:        Mood and Affect: Mood normal.        Behavior: Behavior normal.        Thought Content: Thought content normal.        Judgment: Judgment normal.         Assessment & Plan    1. Type 2 diabetes mellitus without complication, without long-term current use of insulin (HCC) 7.3 today--diet and exercise stressed. Microalbumin on next visit.RTC 4 months. - POCT glycosylated  hemoglobin (Hb A1C)  2. Other hyperlipidemia On crestor.    I have done the exam and reviewed the above chart and it is accurate to the best of my knowledge. Development worker, community has been used in this note in any air is in the dictation or transcription are unintentional.  Wilhemena Durie, MD  [place]

## 2019-01-24 ENCOUNTER — Other Ambulatory Visit: Payer: Self-pay

## 2019-01-24 ENCOUNTER — Ambulatory Visit (INDEPENDENT_AMBULATORY_CARE_PROVIDER_SITE_OTHER): Payer: BC Managed Care – PPO | Admitting: Family Medicine

## 2019-01-24 ENCOUNTER — Encounter: Payer: Self-pay | Admitting: Family Medicine

## 2019-01-24 VITALS — BP 118/77 | HR 74 | Temp 98.0°F | Resp 16 | Wt 259.0 lb

## 2019-01-24 DIAGNOSIS — Z6835 Body mass index (BMI) 35.0-35.9, adult: Secondary | ICD-10-CM | POA: Diagnosis not present

## 2019-01-24 DIAGNOSIS — E782 Mixed hyperlipidemia: Secondary | ICD-10-CM

## 2019-01-24 DIAGNOSIS — E119 Type 2 diabetes mellitus without complications: Secondary | ICD-10-CM | POA: Diagnosis not present

## 2019-01-24 LAB — POCT GLYCOSYLATED HEMOGLOBIN (HGB A1C)
Est. average glucose Bld gHb Est-mCnc: 186
Hemoglobin A1C: 8.1 % — AB (ref 4.0–5.6)

## 2019-01-24 NOTE — Progress Notes (Signed)
Patient: Connor Berger Male    DOB: 1975-11-25   43 y.o.   MRN: [id number] Visit Date: 01/24/2019  Today's Provider: Wilhemena Durie, MD   Chief Complaint  Patient presents with  . Diabetes   Subjective:   HPI    Diabetes Mellitus Type II, Follow-up:  Pt feels well . He has not been exercising or watching his diet,. Lab Results  Component Value Date   HGBA1C 8.1 (A) 01/24/2019   HGBA1C 7.3 (A) 09/23/2018   HGBA1C 7.3 (H) 05/25/2018    Last seen for diabetes 4 months ago.  Management since then includes no changes. He reports good compliance with treatment. He is not having side effects.  Current symptoms include none and have been stable. Home blood sugar records: fasting range: 100-150  Episodes of hypoglycemia? no   Current insulin regiment: Is not on insulin Most Recent Eye Exam: up to date  Weight trend: stable Prior visit with dietician: No Current exercise: no regular exercise  Pertinent Labs:    Component Value Date/Time   CHOL 116 05/25/2018 1107   TRIG 76 05/25/2018 1107   HDL 58 05/25/2018 1107   LDLCALC 43 05/25/2018 1107   CREATININE 1.08 05/25/2018 1107    Wt Readings from Last 3 Encounters:  01/24/19 259 lb (117.5 kg)  09/23/18 263 lb 12.8 oz (119.7 kg)  05/25/18 260 lb (117.9 kg)    No Known Allergies   Current Outpatient Medications:  .  glucose blood (FREESTYLE LITE) test strip, Check sugar once daily, DX E11.9, Disp: 100 each, Rfl: 12 .  Lancets (FREESTYLE) lancets, Check sugar once daily DX E11.9, Disp: 100 each, Rfl: 12 .  metFORMIN (GLUCOPHAGE) 1000 MG tablet, TAKE 1 TABLET BY MOUTH ONCE DAILY WITH DINNER., Disp: 30 tablet, Rfl: 12 .  Multiple Vitamin (MULTIVITAMIN) tablet, Take 1 tablet by mouth daily., Disp: , Rfl:  .  rosuvastatin (CRESTOR) 10 MG tablet, TAKE 1 TABLET ONCE DAILY (Patient not taking: Reported on 01/24/2019), Disp: 30 tablet, Rfl: 0  Review of Systems  Constitutional: Negative for activity change,  diaphoresis and fatigue.  Respiratory: Negative for shortness of breath.   Cardiovascular: Negative for chest pain, palpitations and leg swelling.  Gastrointestinal: Negative.   Endocrine: Negative.   Musculoskeletal: Negative for arthralgias.  Allergic/Immunologic: Negative.   Neurological: Negative for dizziness, light-headedness and headaches.  Psychiatric/Behavioral: Negative for agitation, self-injury, sleep disturbance and suicidal ideas. The patient is not nervous/anxious.     Social History   Tobacco Use  . Smoking status: Never Smoker  . Smokeless tobacco: Never Used  Substance Use Topics  . Alcohol use: No      Objective:   BP 118/77   Pulse 74   Temp 98 F (36.7 C)   Resp 16   Wt 259 lb (117.5 kg)   BMI 35.13 kg/m  Vitals:   01/24/19 1103  BP: 118/77  Pulse: 74  Resp: 16  Temp: 98 F (36.7 C)  Weight: 259 lb (117.5 kg)     Physical Exam Vitals signs reviewed.  Constitutional:      Appearance: He is well-developed. He is obese.  HENT:     Head: Normocephalic and atraumatic.  Eyes:     General: No scleral icterus.    Conjunctiva/sclera: Conjunctivae normal.  Neck:     Thyroid: No thyromegaly.  Cardiovascular:     Rate and Rhythm: Normal rate and regular rhythm.     Heart sounds: Normal heart sounds.  Pulmonary:     Effort: Pulmonary effort is normal.     Breath sounds: Normal breath sounds.  Abdominal:     Palpations: Abdomen is soft.  Musculoskeletal:     Right lower leg: No edema.     Left lower leg: No edema.  Skin:    General: Skin is warm and dry.  Neurological:     General: No focal deficit present.     Mental Status: He is alert and oriented to person, place, and time.     Comments: Normal monofilament exam of feet.  Psychiatric:        Mood and Affect: Mood normal.        Behavior: Behavior normal.        Thought Content: Thought content normal.        Judgment: Judgment normal.      Results for orders placed or performed  in visit on 01/24/19  POCT glycosylated hemoglobin (Hb A1C)  Result Value Ref Range   Hemoglobin A1C 8.1 (A) 4.0 - 5.6 %   HbA1c POC (<> result, manual entry)     HbA1c, POC (prediabetic range)     HbA1c, POC (controlled diabetic range)     Est. average glucose Bld gHb Est-mCnc 186        Assessment & Plan    1. Type 2 diabetes mellitus without complication, without long-term current use of insulin ([place]) Work on diet and exercise habits.RTC 4 months.may ned actos. - POCT glycosylated hemoglobin (Hb A1C)--8.1 today--was 7.3  2. Class 2 severe obesity due to excess calories with serious comorbidity and body mass index (BMI) of 35.0 to 35.9 in adult [facility]) lifestyle stressed.  3. Mixed hyperlipidemia On crestor.     Kade Demicco Cranford Mon, MD  [facility]

## 2019-04-06 ENCOUNTER — Encounter: Payer: Self-pay | Admitting: Family Medicine

## 2019-04-07 ENCOUNTER — Telehealth: Payer: Self-pay

## 2019-04-07 NOTE — Telephone Encounter (Signed)
Left message for patient to call back regarding questions about FMLA paperwork he requested to have filled out.

## 2019-04-08 NOTE — Telephone Encounter (Signed)
Pt calling back regarding the FMLA paperwork.  Please call and leave a message on MyChart.  Thanks, [employer]

## 2019-04-11 ENCOUNTER — Encounter: Payer: Self-pay | Admitting: Family Medicine

## 2019-04-12 ENCOUNTER — Telehealth: Payer: Self-pay

## 2019-04-12 NOTE — Telephone Encounter (Signed)
Left message to call back  

## 2019-04-12 NOTE — Telephone Encounter (Signed)
I cannot write a note for something I was not involved with. I know nothing about the issue he is talking about.

## 2019-04-12 NOTE — Telephone Encounter (Signed)
Spoke with patient and he states that he was out of work for approximately 3 weeks for personal mental issues and needs the FMLA forms filled out he sent through [employer] The dates were approximately 03/09/2019 -04/01/2019. Please review.

## 2019-04-14 NOTE — Telephone Encounter (Signed)
Message was sent via mychart. He was advised to contact us for further questions if necessary.

## 2019-04-21 ENCOUNTER — Other Ambulatory Visit: Payer: Self-pay

## 2019-04-21 ENCOUNTER — Ambulatory Visit: Payer: BC Managed Care – PPO | Admitting: Family Medicine

## 2019-04-21 ENCOUNTER — Encounter: Payer: Self-pay | Admitting: Family Medicine

## 2019-04-21 VITALS — BP 108/71 | HR 76 | Temp 97.5°F | Resp 16 | Wt 224.0 lb

## 2019-04-21 DIAGNOSIS — E119 Type 2 diabetes mellitus without complications: Secondary | ICD-10-CM | POA: Diagnosis not present

## 2019-04-21 DIAGNOSIS — F840 Autistic disorder: Secondary | ICD-10-CM | POA: Diagnosis not present

## 2019-04-21 DIAGNOSIS — E7801 Familial hypercholesterolemia: Secondary | ICD-10-CM

## 2019-04-21 NOTE — Progress Notes (Signed)
Patient: Connor Berger Male    DOB: 17-Jun-1976   43 y.o.   MRN: [id number] Visit Date: 04/21/2019  Today's Provider: Wilhemena Durie, MD   No chief complaint on file.  Subjective:     HPI   Patient is here to discuss FLMA form.  Patient has been followed here for years for type 2 diabetes, obesity, hyperlipidemia. He now admits to me that he has been followed for years and has a Social worker at [facility] for autism and associated adjustment reaction.  He has been out of work since 03/08/2019 due to these  Stressors.  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  There are willing for him to fill out FMLA papers and work 4 hours/day from now until January 1.  No Known Allergies   Current Outpatient Medications:  .  glucose blood (FREESTYLE LITE) test strip, Check sugar once daily, DX E11.9, Disp: 100 each, Rfl: 12 .  Lancets (FREESTYLE) lancets, Check sugar once daily DX E11.9, Disp: 100 each, Rfl: 12 .  metFORMIN (GLUCOPHAGE) 1000 MG tablet, TAKE 1 TABLET BY MOUTH ONCE DAILY WITH DINNER., Disp: 30 tablet, Rfl: 12 .  Multiple Vitamin (MULTIVITAMIN) tablet, Take 1 tablet by mouth daily., Disp: , Rfl:  .  rosuvastatin (CRESTOR) 10 MG tablet, TAKE 1 TABLET ONCE DAILY, Disp: 30 tablet, Rfl: 0  Review of Systems  Constitutional: Negative for appetite change, chills and fever.  Respiratory: Negative for chest tightness, shortness of breath and wheezing.   Cardiovascular: Negative for chest pain and palpitations.  Gastrointestinal: Negative for abdominal pain, nausea and vomiting.  Allergic/Immunologic: Negative.   Hematological: Negative.   Psychiatric/Behavioral: Positive for agitation. The patient is nervous/anxious.     Social History   Tobacco Use  . Smoking status: Never Smoker  . Smokeless tobacco: Never Used  Substance Use Topics  . Alcohol use: No      Objective:   BP 108/71 (BP Location: Right Arm, Patient Position:  Sitting, Cuff Size: Large)   Pulse 76   Temp (!) 97.5 F (36.4 C) (Other (Comment))   Resp 16   Wt 224 lb (101.6 kg)   SpO2 96%   BMI 30.38 kg/m  Vitals:   04/21/19 1553  BP: 108/71  Pulse: 76  Resp: 16  Temp: (!) 97.5 F (36.4 C)  TempSrc: Other (Comment)  SpO2: 96%  Weight: 224 lb (101.6 kg)  Body mass index is 30.38 kg/m.   Physical Exam Constitutional:      Appearance: Normal appearance.  HENT:     Right Ear: External ear normal.     Left Ear: External ear normal.  Neurological:     Mental Status: He is alert.  Psychiatric:        Mood and Affect: Mood normal.        Behavior: Behavior normal.        Judgment: Judgment normal.      No results found for any visits on 04/21/19.     Assessment & Plan    1. Autism FMLA is filled out for him to work 4 hours/day 5 days a week until January 1. More than 50% 25 minute visit spent in counseling or coordination of care  2. Type 2 diabetes mellitus without complication, without long-term current use of insulin (HCC) A1c on next visit  3. 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     Richard Cranford Mon, MD  [facility]  [facility]  

## 2019-05-15 ENCOUNTER — Other Ambulatory Visit: Payer: Self-pay | Admitting: Family Medicine

## 2019-05-15 DIAGNOSIS — E119 Type 2 diabetes mellitus without complications: Secondary | ICD-10-CM

## 2019-05-25 ENCOUNTER — Ambulatory Visit: Payer: BC Managed Care – PPO | Admitting: Family Medicine

## 2019-05-25 NOTE — Progress Notes (Signed)
Patient: Connor Berger Male    DOB: [birth year]   43 y.o.   MRN: [id number] Visit Date: 05/26/2019  Today's Provider: Wilhemena Durie, MD   Chief Complaint  Patient presents with  . Follow-up  . Diabetes  . Hyperlipidemia   Subjective:     HPI  He is working with EAP/counsellor trying to get back to full time work. Autism From 04/21/2019-FMLA is filled out for him to work 4 hours/day 5 days a week until January 1. More than 50% 25 minute visit spent in counseling or coordination of care  Type 2 diabetes mellitus without complication, without long-term current use of insulin ([place]) From 04/21/2019-A1c on next visit  Familial hypercholesterolemia From 04/21/2019-Patient now on Crestor.   No Known Allergies   Current Outpatient Medications:  .  glucose blood (FREESTYLE LITE) test strip, Check sugar once daily, DX E11.9, Disp: 100 each, Rfl: 12 .  Lancets (FREESTYLE) lancets, Check sugar once daily DX E11.9, Disp: 100 each, Rfl: 12 .  metFORMIN (GLUCOPHAGE) 1000 MG tablet, TAKE 1 TABLET BY MOUTH ONCE DAILY WITH DINNER., Disp: 30 tablet, Rfl: 12 .  Multiple Vitamin (MULTIVITAMIN) tablet, Take 1 tablet by mouth daily., Disp: , Rfl:  .  rosuvastatin (CRESTOR) 10 MG tablet, TAKE 1 TABLET ONCE DAILY (Patient not taking: Reported on 05/26/2019), Disp: 30 tablet, Rfl: 0  Review of Systems  Constitutional: Negative for appetite change, chills and fever.  Eyes: Negative.   Respiratory: Negative for chest tightness, shortness of breath and wheezing.   Cardiovascular: Negative for chest pain and palpitations.  Gastrointestinal: Negative for abdominal pain, nausea and vomiting.  Endocrine: Negative.   Musculoskeletal: Negative.   Allergic/Immunologic: Negative.   Neurological: Negative.   Psychiatric/Behavioral: Negative.     Social History   Tobacco Use  . Smoking status: Never Smoker  . Smokeless tobacco: Never Used  Substance Use Topics  . Alcohol use: No       Objective:   BP 104/78 (BP Location: Right Arm, Patient Position: Sitting, Cuff Size: Large)   Pulse 84   Temp (!) 96.8 F (36 C) (Other (Comment))   Resp 16   Ht 6' (1.829 m)   Wt 214 lb (97.1 kg)   SpO2 96%   BMI 29.02 kg/m  Vitals:   05/26/19 1353  BP: 104/78  Pulse: 84  Resp: 16  Temp: (!) 96.8 F (36 C)  TempSrc: Other (Comment)  SpO2: 96%  Weight: 214 lb (97.1 kg)  Height: 6' (1.829 m)  Body mass index is 29.02 kg/m.   Physical Exam Vitals signs reviewed.  Constitutional:      Appearance: He is well-developed. He is obese.  HENT:     Head: Normocephalic and atraumatic.  Eyes:     General: No scleral icterus.    Conjunctiva/sclera: Conjunctivae normal.  Neck:     Thyroid: No thyromegaly.  Cardiovascular:     Rate and Rhythm: Normal rate and regular rhythm.     Heart sounds: Normal heart sounds.  Pulmonary:     Effort: Pulmonary effort is normal.     Breath sounds: Normal breath sounds.  Abdominal:     Palpations: Abdomen is soft.  Musculoskeletal:     Right lower leg: No edema.     Left lower leg: No edema.  Skin:    General: Skin is warm and dry.  Neurological:     General: No focal deficit present.     Mental Status: He is alert  and oriented to person, place, and time.     Comments: Normal monofilament exam of feet.  Psychiatric:        Mood and Affect: Mood normal.        Behavior: Behavior normal.        Thought Content: Thought content normal.        Judgment: Judgment normal.      No results found for any visits on 05/26/19.     Assessment & Plan    1. Type 2 diabetes mellitus without complication, without long-term current use of insulin (HCC)  - POCT HgB A1C - POCT UA - Microalbumin - blood glucose meter kit and supplies; Dispense based on patient and insurance preference. Use once daily as directed. (FOR ICD-E11.9).  Dispense: 1 each; Refill: 0  2. Autism Pt getting counselling in how to deal with work issues.  3.  Class 2 severe obesity due to excess calories with serious comorbidity and body mass index (BMI) of 35.0 to 35.9 in adult [facility]) With DM/HLD   Follow up in 4 months for CPE.      Richard Cranford Mon, MD  [facility]

## 2019-05-26 ENCOUNTER — Encounter: Payer: Self-pay | Admitting: Family Medicine

## 2019-05-26 ENCOUNTER — Ambulatory Visit (INDEPENDENT_AMBULATORY_CARE_PROVIDER_SITE_OTHER): Payer: BC Managed Care – PPO | Admitting: Family Medicine

## 2019-05-26 ENCOUNTER — Other Ambulatory Visit: Payer: Self-pay

## 2019-05-26 VITALS — BP 104/78 | HR 84 | Temp 96.8°F | Resp 16 | Ht 72.0 in | Wt 214.0 lb

## 2019-05-26 DIAGNOSIS — Z6835 Body mass index (BMI) 35.0-35.9, adult: Secondary | ICD-10-CM | POA: Diagnosis not present

## 2019-05-26 DIAGNOSIS — E119 Type 2 diabetes mellitus without complications: Secondary | ICD-10-CM | POA: Diagnosis not present

## 2019-05-26 DIAGNOSIS — F840 Autistic disorder: Secondary | ICD-10-CM

## 2019-05-26 LAB — POCT UA - MICROALBUMIN: Microalbumin Ur, POC: 50 mg/L

## 2019-05-26 LAB — POCT GLYCOSYLATED HEMOGLOBIN (HGB A1C)
Est. average glucose Bld gHb Est-mCnc: 126
Hemoglobin A1C: 6 % — AB (ref 4.0–5.6)

## 2019-05-26 MED ORDER — BLOOD GLUCOSE METER KIT: PACK | 0 refills | Status: AC

## 2019-05-30 ENCOUNTER — Ambulatory Visit: Payer: BC Managed Care – PPO | Admitting: Family Medicine

## 2019-06-10 ENCOUNTER — Telehealth: Payer: Self-pay

## 2019-06-10 NOTE — Telephone Encounter (Signed)
Pt calling back to check status. Please advise

## 2019-06-10 NOTE — Telephone Encounter (Signed)
From [facility]   Copied from [place] [id number]. Topic: General - Other >> Jun 10, 2019  9:30 AM Oneta Rack wrote: Patient faxed over FMLA paperwork 4 pages to [phone] and would like a follow up today to confirm when received - best [phone]

## 2019-06-13 NOTE — Telephone Encounter (Signed)
From [facility] 

## 2019-06-13 NOTE — Telephone Encounter (Signed)
Pt called in checking on status of FMLA paperwork. Please advise as pt is wanting done today.

## 2019-06-14 ENCOUNTER — Telehealth: Payer: Self-pay

## 2019-06-14 NOTE — Telephone Encounter (Signed)
Patient dropped off a copy of his FMLA forms to be filled out. They have been given to Dr. Rosanna Randy to review.

## 2019-06-14 NOTE — Telephone Encounter (Signed)
We have spoken with Dr. Rosanna Randy and we have not seen any paperwork regarding his request. Patient will have to resend to the office.   I have spoken with the patient and he is going to try to attach a copy in a mychart message since he has been unable to send it by fax. If that does not work he will drop off a copy.

## 2019-06-22 NOTE — Telephone Encounter (Signed)
Papers will be ready by Monday

## 2019-06-23 NOTE — Telephone Encounter (Signed)
Pt called to request that they get faxed to the two separate fax numbers on the cover sheets. Please advise, he says you can call or message him via MyChart if you have any questions.

## 2019-06-27 NOTE — Telephone Encounter (Signed)
Pt calling in about these forms. He states that one of the fax numbers that he provided was incorrect. The new fax # [phone] for the fmla form. Please advise.

## 2019-06-28 NOTE — Telephone Encounter (Signed)
Done

## 2019-08-01 ENCOUNTER — Telehealth: Payer: Self-pay

## 2019-08-01 NOTE — Telephone Encounter (Signed)
Copied from [place]. Topic: General - Other >> Aug 01, 2019 12:01 PM Greggory Keen D wrote: Reason for CRM: pt called saying he left FMLA papers to be completed by Dr. Rosanna Randy and to be faxed to Ryann Buie at [phone] at [facility].  He said they have not received the forms yet.  He is wanting to know if there is anything else he needs to do.  CB#  [phone]

## 2019-08-01 NOTE — Telephone Encounter (Signed)
Form have been faxed today.

## 2019-09-21 NOTE — Progress Notes (Signed)
Patient: Connor Berger, Male    DOB: 02/03/1976, 44 y.o.   MRN: [id number] Visit Date: 09/22/2019  Today's Provider: Wilhemena Durie, MD   Chief Complaint  Patient presents with  . Annual Exam   Subjective:     Annual physical exam Connor Berger is a 44 y.o. male who presents today for health maintenance and complete physical. He feels fairly well. He reports he is exercising. He reports he is sleeping fairly well. Patient is working on diet and exercise for weight loss. He has some stresses his father has stage IV esophageal cancer. -----------------------------------------------------------------   Review of Systems  Constitutional: Negative.   HENT: Negative.   Eyes: Negative.   Respiratory: Negative.   Cardiovascular: Negative.   Gastrointestinal: Negative.   Endocrine: Negative.   Genitourinary: Negative.   Musculoskeletal: Positive for back pain.  Allergic/Immunologic: Negative.   Neurological: Positive for headaches.  Hematological: Negative.   Psychiatric/Behavioral: Negative.     Social History      He  reports that he has never smoked. He has never used smokeless tobacco. He reports that he does not drink alcohol or use drugs.       Social History   Socioeconomic History  . Marital status: Single    Spouse name: Not on file  . Number of children: Not on file  . Years of education: Not on file  . Highest education level: Not on file  Occupational History  . Not on file  Tobacco Use  . Smoking status: Never Smoker  . Smokeless tobacco: Never Used  Substance and Sexual Activity  . Alcohol use: No  . Drug use: No  . Sexual activity: Not on file  Other Topics Concern  . Not on file  Social History Narrative  . Not on file   Social Determinants of Health   Financial Resource Strain:   . Difficulty of Paying Living Expenses:   Food Insecurity:   . Worried About Charity fundraiser in the Last Year:   . Arboriculturist in the Last  Year:   Transportation Needs:   . Film/video editor (Medical):   Marland Kitchen Lack of Transportation (Non-Medical):   Physical Activity:   . Days of Exercise per Week:   . Minutes of Exercise per Session:   Stress:   . Feeling of Stress :   Social Connections:   . Frequency of Communication with Friends and Family:   . Frequency of Social Gatherings with Friends and Family:   . Attends Religious Services:   . Active Member of Clubs or Organizations:   . Attends Archivist Meetings:   Marland Kitchen Marital Status:     Past Medical History:  Diagnosis Date  . Diabetes [facility])      Patient Active Problem List   Diagnosis Date Noted  . 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 Adult BMI 30+ 05/30/2015    Past Surgical History:  Procedure Laterality Date  . MOLE REMOVAL     not sure if it was cancerous  . [place] History        Family Status  Relation Name Status  . Father  Alive  . Mother  Alive  . Brother  Alive  . Brother  Alive  . MGM  (Not Specified)        His family history includes Diabetes in his father  and maternal grandmother.      No Known Allergies   Current Outpatient Medications:  .  blood glucose meter kit and supplies, Dispense based on patient and insurance preference. Use once daily as directed. (FOR ICD-E11.9)., Disp: 1 each, Rfl: 0 .  glucose blood (FREESTYLE LITE) test strip, Check sugar once daily, DX E11.9, Disp: 100 each, Rfl: 12 .  Lancets (FREESTYLE) lancets, Check sugar once daily DX E11.9, Disp: 100 each, Rfl: 12 .  metFORMIN (GLUCOPHAGE) 1000 MG tablet, TAKE 1 TABLET BY MOUTH ONCE DAILY WITH DINNER., Disp: 30 tablet, Rfl: 12 .  Multiple Vitamin (MULTIVITAMIN) tablet, Take 1 tablet by mouth daily., Disp: , Rfl:  .  rosuvastatin (CRESTOR) 10 MG tablet, TAKE 1 TABLET ONCE DAILY (Patient not taking: Reported on 05/26/2019), Disp: 30 tablet, Rfl: 0   Patient Care  Team: Jerrol Banana., MD as PCP - General (Family Medicine)    Objective:    Vitals: There were no vitals taken for this visit.  There were no vitals filed for this visit.   Physical Exam Vitals reviewed.  Constitutional:      Appearance: Normal appearance.  HENT:     Right Ear: External ear normal.     Left Ear: External ear normal.  Eyes:     General: No scleral icterus.    Conjunctiva/sclera: Conjunctivae normal.  Cardiovascular:     Rate and Rhythm: Normal rate and regular rhythm.     Pulses: Normal pulses.     Heart sounds: Normal heart sounds.  Pulmonary:     Effort: Pulmonary effort is normal.     Breath sounds: Normal breath sounds.  Abdominal:     Palpations: Abdomen is soft.  Genitourinary:    Penis: Normal.      Testes: Normal.  Lymphadenopathy:     Cervical: No cervical adenopathy.  Skin:    General: Skin is warm and dry.     Comments: A couple of atypical nevi on back  Neurological:     General: No focal deficit present.     Mental Status: He is alert and oriented to person, place, and time.  Psychiatric:        Mood and Affect: Mood normal.        Behavior: Behavior normal.        Thought Content: Thought content normal.        Judgment: Judgment normal.      Depression Screen PHQ 2/9 Scores 09/23/2018 12/23/2017 12/22/2016 06/06/2015  PHQ - 2 Score 0 0 0 0  PHQ- 9 Score - - 2 -       Assessment & Plan:     Routine Health Maintenance and Physical Exam  Exercise Activities and Dietary recommendations Goals   None     Immunization History  Administered Date(s) Administered  . Tdap 01/25/2013    Health Maintenance  Topic Date Due  . PNEUMOCOCCAL POLYSACCHARIDE VACCINE AGE 70-64 HIGH RISK  Never done  . FOOT EXAM  Never done  . HIV Screening  Never done  . OPHTHALMOLOGY EXAM  07/08/2015  . INFLUENZA VACCINE  02/05/2020 (Originally 02/05/2019)  . HEMOGLOBIN A1C  11/23/2019  . URINE MICROALBUMIN  05/25/2020  . TETANUS/TDAP   01/26/2023     Discussed health benefits of physical activity, and encouraged him to engage in regular exercise appropriate for his age and condition.   1. Annual physical exam  - CBC with Differential/Platelet - Comprehensive metabolic panel - Lipid panel - TSH - POCT  urinalysis dipstick - PSA  2. Atypical nevus Refer to dermatology - CBC with Differential/Platelet - Comprehensive metabolic panel - Lipid panel - TSH - POCT urinalysis dipstick - Ambulatory referral to Dermatology  3. Type 2 diabetes mellitus without complication, without long-term current use of insulin (HCC) Pt says he has lost weight on purpose. - HgB A1c - CBC with Differential/Platelet - Comprehensive metabolic panel - Lipid panel - TSH - POCT urinalysis dipstick  4. Familial hypercholesterolemia  - CBC with Differential/Platelet - Comprehensive metabolic panel - Lipid panel - TSH - POCT urinalysis dipstick  5. Essential hypertension  - CBC with Differential/Platelet - Comprehensive metabolic panel - Lipid panel - TSH - POCT urinalysis dipstick  6. Screening for prostate cancer  - CBC with Differential/Platelet - Comprehensive metabolic panel - Lipid panel - TSH - POCT urinalysis dipstick - PSA 7.Autism --------------------------------------------------------------------    Wilhemena Durie, MD  [facility]

## 2019-09-22 ENCOUNTER — Ambulatory Visit (INDEPENDENT_AMBULATORY_CARE_PROVIDER_SITE_OTHER): Payer: BC Managed Care – PPO | Admitting: Family Medicine

## 2019-09-22 ENCOUNTER — Other Ambulatory Visit: Payer: Self-pay

## 2019-09-22 ENCOUNTER — Encounter: Payer: Self-pay | Admitting: Family Medicine

## 2019-09-22 VITALS — BP 109/65 | HR 70 | Temp 96.8°F | Ht 72.0 in | Wt 201.4 lb

## 2019-09-22 DIAGNOSIS — E119 Type 2 diabetes mellitus without complications: Secondary | ICD-10-CM

## 2019-09-22 DIAGNOSIS — I10 Essential (primary) hypertension: Secondary | ICD-10-CM

## 2019-09-22 DIAGNOSIS — E7801 Familial hypercholesterolemia: Secondary | ICD-10-CM

## 2019-09-22 DIAGNOSIS — Z0000 Encounter for general adult medical examination without abnormal findings: Secondary | ICD-10-CM

## 2019-09-22 DIAGNOSIS — Z125 Encounter for screening for malignant neoplasm of prostate: Secondary | ICD-10-CM

## 2019-09-22 DIAGNOSIS — F840 Autistic disorder: Secondary | ICD-10-CM

## 2019-09-22 DIAGNOSIS — D229 Melanocytic nevi, unspecified: Secondary | ICD-10-CM | POA: Diagnosis not present

## 2019-09-22 LAB — POCT URINALYSIS DIPSTICK
Bilirubin, UA: NEGATIVE
Blood, UA: NEGATIVE
Glucose, UA: NEGATIVE
Ketones, UA: NEGATIVE
Leukocytes, UA: NEGATIVE
Nitrite, UA: NEGATIVE
Protein, UA: NEGATIVE
Spec Grav, UA: 1.015 (ref 1.010–1.025)
Urobilinogen, UA: 0.2 E.U./dL
pH, UA: 5 (ref 5.0–8.0)

## 2019-09-23 LAB — CBC WITH DIFFERENTIAL/PLATELET
Basophils Absolute: 0.1 10*3/uL (ref 0.0–0.2)
Basos: 1 %
EOS (ABSOLUTE): 0.2 10*3/uL (ref 0.0–0.4)
Eos: 4 %
Hematocrit: 42.6 % (ref 37.5–51.0)
Hemoglobin: 14.7 g/dL (ref 13.0–17.7)
Immature Grans (Abs): 0 10*3/uL (ref 0.0–0.1)
Immature Granulocytes: 0 %
Lymphocytes Absolute: 1.8 10*3/uL (ref 0.7–3.1)
Lymphs: 30 %
MCH: 30.7 pg (ref 26.6–33.0)
MCHC: 34.5 g/dL (ref 31.5–35.7)
MCV: 89 fL (ref 79–97)
Monocytes Absolute: 0.5 10*3/uL (ref 0.1–0.9)
Monocytes: 8 %
Neutrophils Absolute: 3.5 10*3/uL (ref 1.4–7.0)
Neutrophils: 57 %
Platelets: 230 10*3/uL (ref 150–450)
RBC: 4.79 x10E6/uL (ref 4.14–5.80)
RDW: 13.5 % (ref 11.6–15.4)
WBC: 6.1 10*3/uL (ref 3.4–10.8)

## 2019-09-23 LAB — COMPREHENSIVE METABOLIC PANEL
ALT: 12 IU/L (ref 0–44)
AST: 15 IU/L (ref 0–40)
Albumin/Globulin Ratio: 2.3 — ABNORMAL HIGH (ref 1.2–2.2)
Albumin: 4.2 g/dL (ref 4.0–5.0)
Alkaline Phosphatase: 68 IU/L (ref 39–117)
BUN/Creatinine Ratio: 13 (ref 9–20)
BUN: 12 mg/dL (ref 6–24)
Bilirubin Total: 0.4 mg/dL (ref 0.0–1.2)
CO2: 24 mmol/L (ref 20–29)
Calcium: 9.5 mg/dL (ref 8.7–10.2)
Chloride: 105 mmol/L (ref 96–106)
Creatinine, Ser: 0.91 mg/dL (ref 0.76–1.27)
GFR calc Af Amer: 121 mL/min/{1.73_m2} (ref >59)
GFR calc non Af Amer: 104 mL/min/{1.73_m2} (ref >59)
Globulin, Total: 1.8 g/dL (ref 1.5–4.5)
Glucose: 80 mg/dL (ref 65–99)
Potassium: 4.2 mmol/L (ref 3.5–5.2)
Sodium: 142 mmol/L (ref 134–144)
Total Protein: 6 g/dL (ref 6.0–8.5)

## 2019-09-23 LAB — TSH: TSH: 2.72 u[IU]/mL (ref 0.450–4.500)

## 2019-09-23 LAB — HEMOGLOBIN A1C
Est. average glucose Bld gHb Est-mCnc: 117 mg/dL
Hgb A1c MFr Bld: 5.7 % — ABNORMAL HIGH (ref 4.8–5.6)

## 2019-09-23 LAB — LIPID PANEL
Chol/HDL Ratio: 1.8 ratio (ref 0.0–5.0)
Cholesterol, Total: 141 mg/dL (ref 100–199)
HDL: 77 mg/dL (ref >39)
LDL Chol Calc (NIH): 53 mg/dL (ref 0–99)
Triglycerides: 46 mg/dL (ref 0–149)
VLDL Cholesterol Cal: 11 mg/dL (ref 5–40)

## 2019-09-23 LAB — PSA: Prostate Specific Ag, Serum: 0.6 ng/mL (ref 0.0–4.0)

## 2019-09-26 ENCOUNTER — Emergency Department
Admission: EM | Admit: 2019-09-26 | Discharge: 2019-09-26 | Disposition: A | Discharge status: Home / Self Care | Payer: BC Managed Care – PPO | Attending: Emergency Medicine | Admitting: Emergency Medicine

## 2019-09-26 ENCOUNTER — Other Ambulatory Visit: Payer: Self-pay

## 2019-09-26 ENCOUNTER — Telehealth: Payer: Self-pay

## 2019-09-26 DIAGNOSIS — K0401 Reversible pulpitis: Secondary | ICD-10-CM | POA: Insufficient documentation

## 2019-09-26 DIAGNOSIS — Z79899 Other long term (current) drug therapy: Secondary | ICD-10-CM | POA: Diagnosis not present

## 2019-09-26 DIAGNOSIS — E119 Type 2 diabetes mellitus without complications: Secondary | ICD-10-CM | POA: Diagnosis not present

## 2019-09-26 DIAGNOSIS — K0889 Other specified disorders of teeth and supporting structures: Secondary | ICD-10-CM | POA: Diagnosis present

## 2019-09-26 DIAGNOSIS — Z7984 Long term (current) use of oral hypoglycemic drugs: Secondary | ICD-10-CM | POA: Insufficient documentation

## 2019-09-26 MED ORDER — CLINDAMYCIN HCL 300 MG PO CAPS: 300.0000 mg | ORAL_CAPSULE | Freq: Three times a day (TID) | ORAL | 0 refills | Status: AC

## 2019-09-26 NOTE — ED Triage Notes (Signed)
Pt reports left upper dental pain - reports toothache off and on x3-4 days - denies swelling or drainage

## 2019-09-26 NOTE — Telephone Encounter (Signed)
Called to advise patient of lab results, no answer and vm full. Will try to contact patient again later.

## 2019-09-26 NOTE — Discharge Instructions (Addendum)
You need to follow-up with a dentist for definitive management.  We are prescribing you some antibiotics.  You can take Tylenol 1 g every 8 hours and ibuprofen 600 every 6 hours with food.  Return to the ER for any other concerns    [place] [facility] and [place] [email or web address] [phone])  [facility] [phone])  [place] [phone])  [place] [phone])  [facility] [phone]) This clinic caters to the indigent population and is on a lottery system. Location: [employer] [facility], [place], [place], [place] Clinic Hours: Wednesdays from 6pm - 9pm, patients seen by a lottery system. For dates, call or go to [email or web address] Services: Cleanings, fillings and simple extractions. Payment Options: DENTAL WORK IS FREE OF CHARGE. Bring proof of income or support. Best way to get seen: Arrive at 5:15 pm - this is a lottery, NOT first come/first serve, so arriving earlier will not increase your chances of being seen.     [facility] [phone] Select option 1 for emergencies   Location: [facility], [place], [address], [place] Clinic Hours: No walk-ins accepted - call the day before to schedule an appointment. Check in times are 9:30 am and 1:30 pm. Services: Simple extractions, temporary fillings, pulpectomy/pulp debridement, uncomplicated abscess drainage. Payment Options: PAYMENT IS DUE AT THE TIME OF SERVICE.  Fee is usually $100-200, additional surgical procedures (e.g. abscess drainage) may be extra. Cash, checks, Visa/MasterCard accepted.  Can file Medicaid if patient is covered for dental - patient should call case worker to check. No discount for [facility]  patients. Best way to get seen: MUST call the day before and get onto the schedule. Can usually be seen the next 1-2 days. No walk-ins accepted.     [place] [phone]   Location: [place], [place] Clinic Hours: M, W, Th, F 8am or 1:30pm, Tues 9a or 1:30 - first come/first served. Services: Simple extractions, temporary fillings, uncomplicated abscess drainage.  You do not need to be an [facility] resident. Payment Options: PAYMENT IS DUE AT THE TIME OF SERVICE. Dental insurance, otherwise sliding scale - bring proof of income or support. Depending on income and treatment needed, cost is usually $50-200. Best way to get seen: Arrive early as it is first come/first served.     [facility] [phone]   Location: [place] [facility] Hours: Mon-Thu 8a-5p Services: Most basic dental services including extractions and fillings. Payment Options: PAYMENT IS DUE AT THE TIME OF SERVICE. Sliding scale, up to 50% off - bring proof if income or support. Medicaid with dental option accepted. Best way to get seen: Call to schedule an appointment, can usually be seen within 2 weeks OR they will try to see walk-ins - show up at [place] or 2p (you may have to wait).     [facility] [place] RESIDENTS ONLY   Location: [facility], [place] [address], [place], [place] [address] Clinic Hours: By appointment only. Monday - Thursday 8am-5pm, Friday 8am-12pm Services: Cleanings, fillings, extractions. Payment Options: PAYMENT IS DUE AT THE TIME OF SERVICE. Cash, Visa or MasterCard. Sliding scale - $30 minimum per service. Best way to get seen: Come in to office, complete packet and make an appointment - need  proof of income or support monies for each household member and proof of [facility] residence. Usually takes about a month to  get in.     [facility] [phone]   Location: [address] [place] Clinic Hours: Walk-in Urgent Care Dental Services are offered Monday-Friday mornings only. The numbers of emergencies accepted daily is limited to the number of providers available. Maximum 15 - Mondays, Wednesdays & Thursdays Maximum 10 - Tuesdays & Fridays Services: You do not need to be a [facility] resident to be seen for a dental emergency. Emergencies are defined as pain, swelling, abnormal bleeding, or dental trauma. Walkins will receive x-rays if needed. NOTE: Dental cleaning is not an emergency. Payment Options: PAYMENT IS DUE AT THE TIME OF SERVICE. Minimum co-pay is $40.00 for uninsured patients. Minimum co-pay is $3.00 for Medicaid with dental coverage. Dental Insurance is accepted and must be presented at time of visit. Medicare does not cover dental. Forms of payment: Cash, credit card, checks. Best way to get seen: If not previously registered with the clinic, walk-in dental registration begins at 7:15 am and is on a first come/first serve basis. If previously registered with the clinic, call to make an appointment.     [facility] [place] ONLY   Location: [address] [place], [place] Clinic Hours: Mon-Thu 10a-2p Services: Extractions only! Payment Options: FREE (donations accepted) - bring proof of income or support Best way to get seen: Call and schedule an appointment OR come at 8am on the 1st Monday of every month (except for holidays) when it is first come/first served.     [facility] [phone]   Location: [place], [place] Clinic Hours: Friday mornings Services, Payment Options, Best way to get seen: Call for info

## 2019-09-26 NOTE — ED Provider Notes (Signed)
[facility] Emergency Department Provider Note  ____________________________________________   First MD Initiated Contact with Patient 09/26/19 2123     (approximate)  I have reviewed the triage vital signs and the nursing notes.   HISTORY  Chief Complaint Dental Pain    HPI Connor Berger is a 44 y.o. male with diabetes who comes in with concern for infected tooth.  Patient's been having some intermittent pain on the upper teeth on the left side.  This is been intermittent, mild, has been taking some ibuprofen with mild improvement, nothing makes it worse.  He states that he has had to have a tooth removed previously.  Is also had multiple cavities.  Has not seen a dentist in 3 years.  States that he does want to come to make sure that it was not infected.     He denies any fevers, able to range his neck      Past Medical History:  Diagnosis Date  . Diabetes [facility])     Patient Active Problem List   Diagnosis Date Noted  . 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 Adult BMI 30+ 05/30/2015    Past Surgical History:  Procedure Laterality Date  . MOLE REMOVAL     not sure if it was cancerous  . MOUTH SURGERY      Prior to Admission medications   Medication Sig Start Date End Date Taking? Authorizing Provider  blood glucose meter kit and supplies Dispense based on patient and insurance preference. Use once daily as directed. (FOR ICD-E11.9). 05/26/19   Jerrol Banana., MD  glucose blood (FREESTYLE LITE) test strip Check sugar once daily, DX E11.9 Patient not taking: Reported on 09/22/2019 04/06/17   Jerrol Banana., MD  Lancets (FREESTYLE) lancets Check sugar once daily DX E11.9 04/06/17   Jerrol Banana., MD  metFORMIN (GLUCOPHAGE) 1000 MG tablet TAKE 1 TABLET BY MOUTH ONCE DAILY WITH DINNER. 05/16/19   Jerrol Banana., MD  Multiple Vitamin (MULTIVITAMIN) tablet  Take 1 tablet by mouth daily.    [provider]  rosuvastatin (CRESTOR) 10 MG tablet TAKE 1 TABLET ONCE DAILY Patient not taking: Reported on 05/26/2019 07/08/18   Jerrol Banana., MD    Allergies Amoxicillin  Family History  Problem Relation Age of Onset  . Diabetes Father   . Diabetes Maternal Grandmother     Social History Social History   Tobacco Use  . Smoking status: Never Smoker  . Smokeless tobacco: Never Used  Substance Use Topics  . Alcohol use: No  . Drug use: No      Review of Systems Constitutional: No fever/chills Eyes: No visual changes. ENT: No sore throat.  Tooth pain Cardiovascular: Denies chest pain. Respiratory: Denies shortness of breath. Gastrointestinal: No abdominal pain.  No nausea, no vomiting.  No diarrhea.  No constipation. Genitourinary: Negative for dysuria. Musculoskeletal: Negative for back pain. Skin: Negative for rash. Neurological: Negative for headaches, focal weakness or numbness. All other ROS negative ____________________________________________   PHYSICAL EXAM:  VITAL SIGNS: ED Triage Vitals  Enc Vitals Group     BP 09/26/19 2045 106/70     Pulse Rate 09/26/19 2045 89     Resp 09/26/19 2045 15     Temp 09/26/19 2045 98.3 F (36.8 C)     Temp Source 09/26/19 2045 Oral     SpO2 09/26/19 2045 96 %  Weight 09/26/19 2047 201 lb (91.2 kg)     Height 09/26/19 2047 6' (1.829 m)     Head Circumference --      Peak Flow --      Pain Score 09/26/19 2046 0     Pain Loc --      Pain Edu? --      Excl. in [place]? --     Constitutional: Alert and oriented. Well appearing and in no acute distress. Eyes: Conjunctivae are normal. EOMI. Head: Atraumatic. Nose: No congestion/rhinnorhea. Mouth/Throat: Mucous membranes are moist.  Some teeth discomfort on the second to last upper molar on the left side.  Slight discoloration of the tooth.  No abscess felt. Neck: No stridor. Trachea Midline. FROM Cardiovascular:  Normal rate, regular rhythm. Grossly normal heart sounds.  Good peripheral circulation. Respiratory: Normal respiratory effort.  No retractions. Lungs CTAB. Gastrointestinal: Soft and nontender. No distention. No abdominal bruits.  Musculoskeletal: No lower extremity tenderness nor edema.  No joint effusions. Neurologic:  Normal speech and language. No gross focal neurologic deficits are appreciated.  Skin:  Skin is warm, dry and intact. No rash noted. Psychiatric: Mood and affect are normal. Speech and behavior are normal. GU: Deferred   ____________________________________________   INITIAL IMPRESSION / ASSESSMENT AND PLAN / ED COURSE  Connor Berger was evaluated in Emergency Department on 09/26/2019 for the symptoms described in the history of present illness. He was evaluated in the context of the global COVID-19 pandemic, which necessitated consideration that the patient might be at risk for infection with the SARS-CoV-2 virus that causes COVID-19. Institutional protocols and algorithms that pertain to the evaluation of patients at risk for COVID-19 are in a state of rapid change based on information released by regulatory bodies including the CDC and federal and state organizations. These policies and algorithms were followed during the patient's care in the ED.    Patient is a very well-appearing 44 year old with normal vital signs who comes in with tooth pain.  This is most concerning for pulpitis.  The tooth may actually already be dead given it has started to [employer].  Discussed that patient needs to follow-up with dentist for definitive management.  Patient has no evidence of bacteremia, no abscess, full range of motion of neck.  Lower suspicion for other acute pathology.  Patient states that he will follow-up with dentist as soon as possible       ____________________________________________   FINAL CLINICAL IMPRESSION(S) / ED DIAGNOSES   Final diagnoses:  Pulpitis       MEDICATIONS GIVEN DURING THIS VISIT:  Medications - No data to display   ED Discharge Orders         Ordered    clindamycin (CLEOCIN) 300 MG capsule  3 times daily     09/26/19 2136           Note:  This document was prepared using Dragon voice recognition software and may include unintentional dictation errors.   Vanessa Vance, MD 09/26/19 2139

## 2019-09-26 NOTE — Telephone Encounter (Signed)
-----   Message from Jerrol Banana., MD sent at 09/26/2019  8:14 AM EDT ----- Labs in normal range.  Cholesterol good with LDL of 53.

## 2019-09-28 NOTE — Telephone Encounter (Signed)
Tried calling; pt's voicemail is full.  PEC please advise pt of labs results if he calls back.   Thanks,   -Mickel Baas

## 2019-09-29 NOTE — Telephone Encounter (Signed)
Tried to contact patient for the 3rd time, no answer and vm is full. Will close out message for now.

## 2019-10-26 ENCOUNTER — Other Ambulatory Visit: Payer: Self-pay | Admitting: Family Medicine

## 2019-10-26 DIAGNOSIS — E119 Type 2 diabetes mellitus without complications: Secondary | ICD-10-CM

## 2019-12-15 ENCOUNTER — Other Ambulatory Visit: Payer: Self-pay

## 2019-12-15 ENCOUNTER — Emergency Department
Admission: EM | Admit: 2019-12-15 | Discharge: 2019-12-15 | Disposition: A | Discharge status: Home / Self Care | Payer: BC Managed Care – PPO | Attending: Emergency Medicine | Admitting: Emergency Medicine

## 2019-12-15 ENCOUNTER — Encounter: Payer: Self-pay | Admitting: Emergency Medicine

## 2019-12-15 DIAGNOSIS — B001 Herpesviral vesicular dermatitis: Secondary | ICD-10-CM | POA: Diagnosis present

## 2019-12-15 DIAGNOSIS — E119 Type 2 diabetes mellitus without complications: Secondary | ICD-10-CM | POA: Diagnosis not present

## 2019-12-15 DIAGNOSIS — Z7984 Long term (current) use of oral hypoglycemic drugs: Secondary | ICD-10-CM | POA: Insufficient documentation

## 2019-12-15 MED ORDER — VALACYCLOVIR HCL 500 MG PO TABS
1000.0000 mg | ORAL_TABLET | Freq: Once | ORAL | Status: AC
  Administered 2019-12-15: 1000 mg via ORAL
  Filled 2019-12-15: qty 2

## 2019-12-15 MED ORDER — VALACYCLOVIR HCL 1 G PO TABS: 1000.0000 mg | ORAL_TABLET | Freq: Two times a day (BID) | ORAL | 0 refills | Status: AC

## 2019-12-15 NOTE — ED Triage Notes (Signed)
Patient ambulatory to triage with steady gait, without difficulty or distress noted, pt reports blister to left lower lip couple of days; denies pain; denies hx of same

## 2019-12-15 NOTE — Discharge Instructions (Addendum)
You were seen today for a cold sore.  You received a dose of Valtrex in the ER.  I have given you a prescription for Valtrex to take twice daily for the next 7 days.  Please follow-up with your PCP if symptoms persist or worsen.

## 2019-12-15 NOTE — ED Notes (Signed)
Provider at bedside

## 2019-12-15 NOTE — ED Notes (Signed)
See triage note. Pt c/o "blister" and states has had these a few times in the past. Pt denies fevers. Resting calmly in bed.

## 2019-12-15 NOTE — ED Provider Notes (Signed)
[facility] Emergency Department Provider Note ____________________________________________  Time seen: 2045  I have reviewed the triage vital signs and the nursing notes.  HISTORY  Chief Complaint  Blister   HPI Connor Berger is a 44 y.o. male presents to the ER today with complaint of a blister to his left lower lip.  He reports he noticed this 2 to 3 days ago.  He reports the blister does not itch, burn or tingle.  He has not noticed any discharge from the area.  He has not noticed any other oral lesions.  He has not noticed any lesions on his hands or feet.  He denies runny nose, nasal congestion, ear pain, sore throat, cough or shortness of breath.  He denies fever, chills or body aches.  He has no history of cold sores.  He has not tried anything OTC for this.   Past Medical History:  Diagnosis Date  . Diabetes [facility])     Patient Active Problem List   Diagnosis Date Noted  . 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 Adult BMI 30+ 05/30/2015    Past Surgical History:  Procedure Laterality Date  . MOLE REMOVAL     not sure if it was cancerous  . MOUTH SURGERY      Prior to Admission medications   Medication Sig Start Date End Date Taking? Authorizing Provider  blood glucose meter kit and supplies Dispense based on patient and insurance preference. Use once daily as directed. (FOR ICD-E11.9). 05/26/19   Jerrol Banana., MD  glucose blood (FREESTYLE LITE) test strip Check sugar once daily, DX E11.9 Patient not taking: Reported on 09/22/2019 04/06/17   Jerrol Banana., MD  Lancets (FREESTYLE) lancets Check sugar once daily DX E11.9 04/06/17   Jerrol Banana., MD  metFORMIN (GLUCOPHAGE) 1000 MG tablet TAKE 1 TABLET BY MOUTH ONCE DAILY WITH DINNER. 10/26/19   Jerrol Banana., MD  Multiple Vitamin (MULTIVITAMIN) tablet Take 1 tablet by mouth daily.    [provider]  rosuvastatin (CRESTOR) 10 MG tablet TAKE 1 TABLET ONCE DAILY Patient not taking: Reported on 05/26/2019 07/08/18   Jerrol Banana., MD  valACYclovir (VALTREX) 1000 MG tablet Take 1 tablet (1,000 mg total) by mouth 2 (two) times daily for 2 days. 12/15/19 12/17/19  Jearld Fenton, NP    Allergies Amoxicillin  Family History  Problem Relation Age of Onset  . Diabetes Father   . Diabetes Maternal Grandmother     Social History Social History   Tobacco Use  . Smoking status: Never Smoker  . Smokeless tobacco: Never Used  Vaping Use  . Vaping Use: Never used  Substance Use Topics  . Alcohol use: No  . Drug use: No    Review of Systems  Constitutional: Negative for fever, chills or body aches.. ENT: Positive for blister left lower lip. Negative for runny nose, nasal congestion, ear pain or sore throat. Cardiovascular: Negative for chest pain or chest tightness. Respiratory: Negative for cough or shortness of breath. Skin: Negative for rash.  ____________________________________________  PHYSICAL EXAM:  VITAL SIGNS: ED Triage Vitals  Enc Vitals Group     BP 12/15/19 1907 110/81     Pulse Rate 12/15/19 1907 93     Resp 12/15/19 1907 16     Temp 12/15/19 1907 98.3 F (36.8 C)     Temp Source 12/15/19 1907 Oral  SpO2 12/15/19 1907 96 %     Weight 12/15/19 1907 200 lb (90.7 kg)     Height 12/15/19 1907 6' (1.829 m)     Head Circumference --      Peak Flow --      Pain Score 12/15/19 1912 0     Pain Loc --      Pain Edu? --      Excl. in [place]? --     Constitutional: Alert and oriented. Well appearing and in no distress. Head: Normocephalic and atraumatic. Eyes: Conjunctivae are normal. PERRL. Normal extraocular movements Mouth/Throat: Mucous membranes are moist. No posterior pharynx erythema or exudate noted. Hematological/Lymphatic/Immunological: No cervical lymphadenopathy. Cardiovascular: Normal rate, regular rhythm.  Respiratory: Normal respiratory  effort. No wheezes/rales/rhonchi. Neurologic:  Normal gait without ataxia. Normal speech and language. No gross focal neurologic deficits are appreciated. Skin:  4 mm blister noted of left lower lip. No lesions on the buccal mucosa or palms of the hands/feet. ____________________________________________  INITIAL IMPRESSION / ASSESSMENT AND PLAN / ED COURSE  Cold Sore:  Valtrex 1 gm PO x 1 RX for Valtrex 1 gm PO BID x 7 days  ____________________________________________  FINAL CLINICAL IMPRESSION(S) / ED DIAGNOSES  Final diagnoses:  Cold sore      Jearld Fenton, NP 12/15/19 2057    Nance Pear, MD 12/15/19 2217

## 2020-01-20 NOTE — Progress Notes (Signed)
Established patient visit  I,April Miller,acting as a scribe for Wilhemena Durie, MD.,have documented all relevant documentation on the behalf of Wilhemena Durie, MD,as directed by  Wilhemena Durie, MD while in the presence of Wilhemena Durie, MD.   Patient: Connor Berger   DOB: 10/16/1975   44 y.o. Male  MRN: [id number] Visit Date: 01/23/2020  Today's healthcare provider: Wilhemena Durie, MD   Chief Complaint  Patient presents with  . Diabetes  . Follow-up   Subjective    HPI  Patient is doing well with his habits. He is back to work. Unfortunately his father died yesterday and he is appropriately grieving this problem. Diabetes Mellitus Type II, follow-up  Lab Results  Component Value Date   HGBA1C 5.9 (A) 01/23/2020   HGBA1C 5.7 (H) 09/22/2019   HGBA1C 6.0 (A) 05/26/2019   Last seen for diabetes 4 months ago.  Management since then includes continuing the same treatment. He reports good compliance with treatment. He is not having side effects. none  Home blood sugar records: fasting range: 100-110  Episodes of hypoglycemia? No none   Current insulin regiment: n/a Most Recent Eye Exam: overdue  --------------------------------------------------------------------  Hypertension, follow-up  BP Readings from Last 3 Encounters:  01/23/20 115/75  12/15/19 120/76  09/26/19 108/69   Wt Readings from Last 3 Encounters:  01/23/20 207 lb (93.9 kg)  12/15/19 200 lb (90.7 kg)  09/26/19 201 lb (91.2 kg)     He was last seen for hypertension 4 months ago.  BP at that visit was 109/75. Management since that visit includes; labs checked showing-normal. He reports good compliance with treatment. He is not having side effects. none He is exercising. He is adherent to low salt diet.   Outside blood pressures are not checking.  He does not smoke.  Use of agents associated with hypertension: none.    --------------------------------------------------------------------       Medications: Outpatient Medications Prior to Visit  Medication Sig  . blood glucose meter kit and supplies Dispense based on patient and insurance preference. Use once daily as directed. (FOR ICD-E11.9). (Patient taking differently: Dispense based on patient and insurance preference. Use once daily as directed. (FOR ICD-E11.9). uses ONE TOUCh)  . metFORMIN (GLUCOPHAGE) 1000 MG tablet TAKE 1 TABLET BY MOUTH ONCE DAILY WITH DINNER.  . Multiple Vitamin (MULTIVITAMIN) tablet Take 1 tablet by mouth daily.  Marland Kitchen glucose blood (FREESTYLE LITE) test strip Check sugar once daily, DX E11.9 (Patient not taking: Reported on 09/22/2019)  . Lancets (FREESTYLE) lancets Check sugar once daily DX E11.9 (Patient not taking: Reported on 01/23/2020)  . rosuvastatin (CRESTOR) 10 MG tablet TAKE 1 TABLET ONCE DAILY (Patient not taking: Reported on 05/26/2019)   No facility-administered medications prior to visit.    Review of Systems  Constitutional: Negative for appetite change, chills and fever.  Respiratory: Negative for chest tightness, shortness of breath and wheezing.   Cardiovascular: Negative for chest pain and palpitations.  Gastrointestinal: Negative for abdominal pain, nausea and vomiting.  Psychiatric/Behavioral: Negative.     Last hemoglobin A1c Lab Results  Component Value Date   HGBA1C 5.9 (A) 01/23/2020      Objective    BP 115/75 (BP Location: Left Arm, Patient Position: Sitting, Cuff Size: Large)   Pulse 74   Temp (!) 97.5 F (36.4 C) (Other (Comment))   Resp 16   Ht 6' (1.829 m)   Wt 207 lb (93.9 kg)   SpO2 98%  BMI 28.07 kg/m  BP Readings from Last 3 Encounters:  01/23/20 115/75  12/15/19 120/76  09/26/19 108/69   Wt Readings from Last 3 Encounters:  01/23/20 207 lb (93.9 kg)  12/15/19 200 lb (90.7 kg)  09/26/19 201 lb (91.2 kg)      Physical Exam Vitals reviewed.  Constitutional:       Appearance: He is well-developed. He is obese.  HENT:     Head: Normocephalic and atraumatic.  Eyes:     General: No scleral icterus.    Conjunctiva/sclera: Conjunctivae normal.  Neck:     Thyroid: No thyromegaly.  Cardiovascular:     Rate and Rhythm: Normal rate and regular rhythm.     Heart sounds: Normal heart sounds.  Pulmonary:     Effort: Pulmonary effort is normal.     Breath sounds: Normal breath sounds.  Abdominal:     Palpations: Abdomen is soft.  Musculoskeletal:     Right lower leg: No edema.     Left lower leg: No edema.  Skin:    General: Skin is warm and dry.  Neurological:     General: No focal deficit present.     Mental Status: He is alert and oriented to person, place, and time.     Comments: Normal monofilament exam of feet.  Psychiatric:        Mood and Affect: Mood normal.        Behavior: Behavior normal.        Thought Content: Thought content normal.        Judgment: Judgment normal.       Results for orders placed or performed in visit on 01/23/20  POCT glycosylated hemoglobin (Hb A1C)  Result Value Ref Range   Hemoglobin A1C 5.9 (A) 4.0 - 5.6 %   Est. average glucose Bld gHb Est-mCnc 123     Assessment & Plan     1. Type 2 diabetes mellitus without complication, without long-term current use of insulin (HCC) A1c very good at 5.9. Follow-up in 6 months. Continue to work on diet and exercise. - POCT glycosylated hemoglobin (Hb A1C)  2. Essential hypertension Good control. Follow your up microalbumin. Blood pressure good today.  3. Type 2 diabetes mellitus with complication, with long-term current use of insulin (HCC) On rosuvastatin due to diabetes  4. Autism  Return in about 5 months (around 06/24/2020).      I, Wilhemena Durie, MD, have reviewed all documentation for this visit. The documentation on 01/26/20 for the exam, diagnosis, procedures, and orders are all accurate and complete.    Alexius Ellington Cranford Mon, MD  [facility] [phone] (phone) [phone] (fax)  [place]

## 2020-01-23 ENCOUNTER — Encounter: Payer: Self-pay | Admitting: Family Medicine

## 2020-01-23 ENCOUNTER — Ambulatory Visit: Payer: BC Managed Care – PPO | Admitting: Family Medicine

## 2020-01-23 ENCOUNTER — Other Ambulatory Visit: Payer: Self-pay

## 2020-01-23 VITALS — BP 115/75 | HR 74 | Temp 97.5°F | Resp 16 | Ht 72.0 in | Wt 207.0 lb

## 2020-01-23 DIAGNOSIS — Z794 Long term (current) use of insulin: Secondary | ICD-10-CM

## 2020-01-23 DIAGNOSIS — E119 Type 2 diabetes mellitus without complications: Secondary | ICD-10-CM | POA: Diagnosis not present

## 2020-01-23 DIAGNOSIS — E118 Type 2 diabetes mellitus with unspecified complications: Secondary | ICD-10-CM

## 2020-01-23 DIAGNOSIS — I10 Essential (primary) hypertension: Secondary | ICD-10-CM | POA: Diagnosis not present

## 2020-01-23 DIAGNOSIS — F840 Autistic disorder: Secondary | ICD-10-CM

## 2020-01-23 LAB — POCT GLYCOSYLATED HEMOGLOBIN (HGB A1C)
Est. average glucose Bld gHb Est-mCnc: 123
Hemoglobin A1C: 5.9 % — AB (ref 4.0–5.6)

## 2020-01-26 DIAGNOSIS — Z794 Long term (current) use of insulin: Secondary | ICD-10-CM | POA: Insufficient documentation

## 2020-02-21 ENCOUNTER — Emergency Department
Admission: EM | Admit: 2020-02-21 | Discharge: 2020-02-21 | Disposition: A | Discharge status: Home / Self Care | Payer: BC Managed Care – PPO | Attending: Emergency Medicine | Admitting: Emergency Medicine

## 2020-02-21 ENCOUNTER — Other Ambulatory Visit: Payer: Self-pay

## 2020-02-21 DIAGNOSIS — N509 Disorder of male genital organs, unspecified: Secondary | ICD-10-CM | POA: Insufficient documentation

## 2020-02-21 DIAGNOSIS — Z7984 Long term (current) use of oral hypoglycemic drugs: Secondary | ICD-10-CM | POA: Insufficient documentation

## 2020-02-21 DIAGNOSIS — E119 Type 2 diabetes mellitus without complications: Secondary | ICD-10-CM | POA: Insufficient documentation

## 2020-02-21 DIAGNOSIS — N489 Disorder of penis, unspecified: Secondary | ICD-10-CM

## 2020-02-21 LAB — URINALYSIS, COMPLETE (UACMP) WITH MICROSCOPIC
Bacteria, UA: NONE SEEN
Bilirubin Urine: NEGATIVE
Glucose, UA: NEGATIVE mg/dL
Hgb urine dipstick: NEGATIVE
Ketones, ur: NEGATIVE mg/dL
Leukocytes,Ua: NEGATIVE
Nitrite: NEGATIVE
Protein, ur: NEGATIVE mg/dL
Specific Gravity, Urine: 1.02 (ref 1.005–1.030)
Squamous Epithelial / HPF: NONE SEEN (ref 0–5)
pH: 6 (ref 5.0–8.0)

## 2020-02-21 LAB — CHLAMYDIA/NGC RT PCR (ARMC ONLY)
Chlamydia Tr: NOT DETECTED
N gonorrhoeae: NOT DETECTED

## 2020-02-21 LAB — RAPID HIV SCREEN (HIV 1/2 AB+AG)
HIV 1/2 Antibodies: NONREACTIVE
HIV-1 P24 Antigen - HIV24: NONREACTIVE

## 2020-02-21 NOTE — Discharge Instructions (Addendum)
Your HIV test is negative.  Your syphilis, gonorrhea, chlamydia are pending.  You can check your results on MyChart in the morning.

## 2020-02-21 NOTE — ED Notes (Signed)
Pt declined genital exam at this time; will defer to EDP.

## 2020-02-21 NOTE — ED Triage Notes (Signed)
Is concerned he may have gotten a blood borne disease from stepping on hypodermic needles barefoot (occurred ~3 months ago). Has been feeling "off" recently; noticed something on his penis, "might be a skin tag,". Denies dysuria, penile discharge, fevers.

## 2020-02-21 NOTE — ED Provider Notes (Signed)
[facility] Emergency Department Provider Note  ____________________________________________  Time seen: Approximately 9:44 PM  I have reviewed the triage vital signs and the nursing notes.   HISTORY  Chief Complaint Sore (needle exposure,lesion on penis)    HPI Connor Berger is a 44 y.o. male that presents to the emergency department for STD check. Patient states that he saw a lump on his penis this morning. He did step on something sharp about 3 months ago and was not sure if it was a male or possibly a needle. No recent illness. No fevers. Patient checks his blood sugars regularly and ranges around 100. Last hemoglobin HbA1c was 5.9. Patient has not been sexually active for 6 months. No penile pain, testicular pain, dysuria, penile discharge.  Past Medical History:  Diagnosis Date  . Diabetes [facility])     Patient Active Problem List   Diagnosis Date Noted  . Type 2 diabetes mellitus with complication, with long-term current use of insulin ([place]) 01/26/2020  . 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 MOLE REMOVAL     not sure if it was cancerous  . MOUTH SURGERY      Prior to Admission medications   Medication Sig Start Date End Date Taking? Authorizing Provider  blood glucose meter kit and supplies Dispense based on patient and insurance preference. Use once daily as directed. (FOR ICD-E11.9). Patient taking differently: Dispense based on patient and insurance preference. Use once daily as directed. (FOR ICD-E11.9). uses ONE TOUCh 05/26/19   Jerrol Banana., MD  glucose blood (FREESTYLE LITE) test strip Check sugar once daily, DX E11.9 Patient not taking: Reported on 09/22/2019 04/06/17   Jerrol Banana., MD  Lancets (FREESTYLE) lancets Check sugar once daily DX E11.9 Patient not taking: Reported on 01/23/2020 04/06/17   Jerrol Banana., MD  metFORMIN (GLUCOPHAGE) 1000 MG tablet TAKE 1 TABLET BY MOUTH ONCE DAILY WITH DINNER. 10/26/19   Jerrol Banana., MD  Multiple Vitamin (MULTIVITAMIN) tablet Take 1 tablet by mouth daily.    [provider]  rosuvastatin (CRESTOR) 10 MG tablet TAKE 1 TABLET ONCE DAILY Patient not taking: Reported on 05/26/2019 07/08/18   Jerrol Banana., MD    Allergies Amoxicillin  Family History  Problem Relation Age of Onset  . Diabetes Father   . Diabetes Maternal Grandmother     Social History Social History   Tobacco Use  . Smoking status: Never Smoker  . Smokeless tobacco: Never Used  Vaping Use  . Vaping Use: Never used  Substance Use Topics  . Alcohol use: No  . Drug use: No     Review of Systems  Constitutional: No fever/chills ENT: No upper respiratory complaints. Cardiovascular: No chest pain. Respiratory: No cough. No SOB. Gastrointestinal: No abdominal pain.  No nausea, no vomiting.  Genitourinary: Negative for dysuria. Musculoskeletal: Negative for musculoskeletal pain. Skin: Negative for rash, abrasions, lacerations, ecchymosis. Neurological: Negative for headaches, numbness or tingling   ____________________________________________   PHYSICAL EXAM:  VITAL SIGNS: ED Triage Vitals  Enc Vitals Group     BP 02/21/20 2032 127/80     Pulse Rate 02/21/20 2032 71     Resp 02/21/20 2032 12     Temp 02/21/20 2032 99 F (37.2 C)     Temp Source 02/21/20 2032 Oral     SpO2 02/21/20 2032 99 %  Weight 02/21/20 2034 207 lb (93.9 kg)     Height 02/21/20 2034 6' (1.829 m)     Head Circumference --      Peak Flow --      Pain Score 02/21/20 2034 0     Pain Loc --      Pain Edu? --      Excl. in [place]? --      Constitutional: Alert and oriented. Well appearing and in no acute distress. Eyes: Conjunctivae are normal. PERRL. EOMI. Head: Atraumatic. ENT:      Ears:      Nose: No congestion/rhinnorhea.      Mouth/Throat: Mucous  membranes are moist.  Neck: No stridor.   Cardiovascular: Normal rate, regular rhythm.  Good peripheral circulation. Respiratory: Normal respiratory effort without tachypnea or retractions. Lungs CTAB. Good air entry to the bases with no decreased or absent breath sounds. Gastrointestinal: Bowel sounds 4 quadrants. Soft and nontender to palpation. No guarding or rigidity. No palpable masses. No distention.  Genital: No RN present for genital exam. No lesions or rashes. Musculoskeletal: Full range of motion to all extremities. No gross deformities appreciated. Neurologic:  Normal speech and language. No gross focal neurologic deficits are appreciated.  Skin:  Skin is warm, dry and intact. No rash noted. Psychiatric: Mood and affect are normal. Speech and behavior are normal. Patient exhibits appropriate insight and judgement.   ____________________________________________   LABS (all labs ordered are listed, but only abnormal results are displayed)  Labs Reviewed  URINALYSIS, COMPLETE (UACMP) WITH MICROSCOPIC - Abnormal; Notable for the following components:      Result Value   Color, Urine YELLOW (*)    APPearance CLEAR (*)    All other components within normal limits  CHLAMYDIA/NGC RT PCR ([facility] ONLY)  RAPID HIV SCREEN (HIV 1/2 AB+AG)  RPR   ____________________________________________  EKG   ____________________________________________  RADIOLOGY   No results found.  ____________________________________________    PROCEDURES  Procedure(s) performed:    Procedures    Medications - No data to display   ____________________________________________   INITIAL IMPRESSION / ASSESSMENT AND PLAN / ED COURSE  Pertinent labs & imaging results that were available during my care of the patient were reviewed by me and considered in my medical decision making (see chart for details).  Review of the [place] CSRS was performed in accordance of the [place] prior to  dispensing any controlled drugs.     Patient presented to the emergency department for STD check. Patient noticed a lesion to his penis this morning. No lesion on exam and patient does not see a lesion anymore either. Patient denies any additional symptoms. Urinalysis noncontributory for infection. Rapid HIV is negative. RPR, chlamydia, gonorrhea are pending at this time.  Patient is to follow up with primary care as directed. Patient is given ED precautions to return to the ED for any worsening or new symptoms.  Connor Berger was evaluated in Emergency Department on 02/21/2020 for the symptoms described in the history of present illness. He was evaluated in the context of the global COVID-19 pandemic, which necessitated consideration that the patient might be at risk for infection with the SARS-CoV-2 virus that causes COVID-19. Institutional protocols and algorithms that pertain to the evaluation of patients at risk for COVID-19 are in a state of rapid change based on information released by regulatory bodies including the CDC and federal and state organizations. These policies and algorithms were followed during the patient's care in the ED.  ____________________________________________  FINAL CLINICAL IMPRESSION(S) / ED DIAGNOSES  Final diagnoses:  Penile lesion      NEW MEDICATIONS STARTED DURING THIS VISIT:  ED Discharge Orders    None          This chart was dictated using voice recognition software/Dragon. Despite best efforts to proofread, errors can occur which can change the meaning. Any change was purely unintentional.    Laban Emperor, PA-C 02/21/20 2303    Harvest Dark, MD 02/21/20 2320

## 2020-02-28 LAB — RPR, QUANT+TP ABS (REFLEX)
Rapid Plasma Reagin, Quant: 1:4 {titer} — ABNORMAL HIGH
T Pallidum Abs: REACTIVE — AB

## 2020-02-29 ENCOUNTER — Telehealth: Payer: Self-pay | Admitting: Emergency Medicine

## 2020-02-29 NOTE — Telephone Encounter (Signed)
Called patient to inform of std test results with rpr reactive.  I left message asking him to call me.

## 2020-03-02 NOTE — Telephone Encounter (Signed)
Called patient again to inform of rpr result and need for treatment.  No answer. I left a message asking him to either call me or consult his pcp.

## 2020-03-06 ENCOUNTER — Ambulatory Visit: Payer: Self-pay

## 2020-03-08 ENCOUNTER — Ambulatory Visit: Payer: Self-pay

## 2020-03-27 ENCOUNTER — Telehealth: Payer: Self-pay | Admitting: Family Medicine

## 2020-03-27 NOTE — Telephone Encounter (Signed)
Call from [place], patient needs appointment for treatment and lab work for + syphilis at [facility].  Appointment scheduled for 03/28/2020 @ 1 pm.  Junious Dresser, RN

## 2020-03-28 ENCOUNTER — Ambulatory Visit: Payer: Self-pay | Admitting: Physician Assistant

## 2020-03-28 ENCOUNTER — Other Ambulatory Visit: Payer: Self-pay

## 2020-03-28 DIAGNOSIS — Z113 Encounter for screening for infections with a predominantly sexual mode of transmission: Secondary | ICD-10-CM

## 2020-03-28 DIAGNOSIS — A539 Syphilis, unspecified: Secondary | ICD-10-CM

## 2020-03-28 MED ORDER — DOXYCYCLINE HYCLATE 100 MG PO TABS: 100.0000 mg | ORAL_TABLET | Freq: Two times a day (BID) | ORAL | 0 refills | Status: AC

## 2020-03-31 ENCOUNTER — Encounter: Payer: Self-pay | Admitting: Physician Assistant

## 2020-03-31 NOTE — Progress Notes (Signed)
° °  [facility] Department STI clinic/screening visit  Subjective:  Connor Berger is a 44 y.o. male being seen today for an STI screening visit. The patient reports they do not have symptoms.    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  Reports that he was seen in the ER because he had "something funny in the genital area".  States that he was tested for "everything" at that time and that he was notified that his Syphilis test was positive.  Reports that he no longer has the lesion/"funny area" in the genital area. Reports that he takes Metformin for Diabetes.  Per chart review, patient had RPR that was reactive 1:4 and confirmatory testing also reactive.  Other test results were negative and no lesion was noted on exam.    Objective:  There were no vitals filed for this visit.  Physical Exam Constitutional:      General: He is not in acute distress.    Appearance: Normal appearance.  HENT:     Head: Normocephalic and atraumatic.     Comments: No nits, lice or hair loss. Eyes:     Conjunctiva/sclera: Conjunctivae normal.  Pulmonary:     Effort: Pulmonary effort is normal.  Skin:    General: Skin is warm and dry.     Findings: No rash.  Neurological:     Mental Status: He is alert and oriented to person, place, and time.  Psychiatric:        Mood and Affect: Mood normal.        Behavior: Behavior normal.        Thought Content: Thought content normal.        Judgment: Judgment normal.       Assessment and Plan:  Connor Berger is a 44  y.o. male presenting to the [facility] Department for STI screening  1. Screening for STD (sexually transmitted disease) Patient into clinic without symptoms. Counseled patient re:  Syphilis and recommendation to re-draw blood to see if titer rises or stays the same to determine how long he may have had Syphilis.  Counseled re:  Sequelae if Syphilis is not properly treated. Patient declines blood draw today and states that he will take medicine for latent/unknown duration of Syphilis. Rec condoms with all sex.   2. Syphilis Patient not sure of reaction to Amoxicillin, so will treat Syphilis for 28 days with Doxycycline 100 mg #56 1 po BID. No sex until after treatment is completed. RTC in 6 mo, 12 mo and then annually to monitor titer. Call with questions or concerns. - doxycycline (VIBRA-TABS) 100 MG tablet; Take 1 tablet (100 mg total) by mouth 2 (two) times daily for 28 days.  Dispense: 56 tablet; Refill: 0     No follow-ups on file.  Future Appointments  Date Time Provider [place]  05/28/2020 11:00 AM Jerrol Banana., MD [facility], [place]

## 2020-04-01 NOTE — Progress Notes (Signed)
Chart reviewed by Pharmacist  Suzanne Walker PharmD, Contract Pharmacist at [facility]  

## 2020-05-28 ENCOUNTER — Ambulatory Visit: Payer: BC Managed Care – PPO | Admitting: Family Medicine

## 2020-06-07 ENCOUNTER — Ambulatory Visit: Payer: Self-pay | Admitting: Family Medicine

## 2020-06-20 NOTE — Progress Notes (Signed)
I,Roshena L Chambers,acting as a scribe for Wilhemena Durie, MD.,have documented all relevant documentation on the behalf of Wilhemena Durie, MD,as directed by  Wilhemena Durie, MD while in the presence of Wilhemena Durie, MD.   Established patient visit   Patient: Connor Berger   DOB: 04-22-1976   44 y.o. Male  MRN: [id number] Visit Date: 06/21/2020  Today's healthcare provider: Wilhemena Durie, MD   Chief Complaint  Patient presents with  . Diabetes  . Hypertension   Subjective    HPI  Patient now works at [employer].  He has been working on intentional weight loss. Diabetes Mellitus Type II, follow-up  Lab Results  Component Value Date   HGBA1C 5.6 06/21/2020   HGBA1C 5.9 (A) 01/23/2020   HGBA1C 5.7 (H) 09/22/2019   Last seen for diabetes 5 months ago.  Management since then includes continuing the same treatment. Continue to work on diet and exercise. He reports good compliance with treatment. He is not having side effects.   Home blood sugar records: fasting range: 90-100  Episodes of hypoglycemia? Yes one episode 1 month ago   Current insulin regiment: none Most Recent Eye Exam: not UTD  --------------------------------------------------------------------------------------------------- Hypertension, follow-up  BP Readings from Last 3 Encounters:  06/21/20 110/68  02/21/20 127/80  01/23/20 115/75   Wt Readings from Last 3 Encounters:  06/21/20 204 lb (92.5 kg)  02/21/20 207 lb (93.9 kg)  01/23/20 207 lb (93.9 kg)     He was last seen for hypertension 5 months ago.  BP at that visit was 115/75. Management since that visit includes; Good control. Follow your up microalbumin. Blood pressure good today. He reports good compliance with treatment. He is not having side effects.  He is exercising. He is not adherent to low salt diet.   Outside blood pressures are not checked.  He does not smoke.  Use of agents  associated with hypertension: none.   ---------------------------------------------------------------------------------------------------      Medications: Outpatient Medications Prior to Visit  Medication Sig  . blood glucose meter kit and supplies Dispense based on patient and insurance preference. Use once daily as directed. (FOR ICD-E11.9). (Patient taking differently: Dispense based on patient and insurance preference. Use once daily as directed. (FOR ICD-E11.9). uses ONE TOUCh)  . glucose blood (FREESTYLE LITE) test strip Check sugar once daily, DX E11.9  . Lancets (FREESTYLE) lancets Check sugar once daily DX E11.9  . metFORMIN (GLUCOPHAGE) 1000 MG tablet TAKE 1 TABLET BY MOUTH ONCE DAILY WITH DINNER.  . Multiple Vitamin (MULTIVITAMIN) tablet Take 1 tablet by mouth daily.  . rosuvastatin (CRESTOR) 10 MG tablet TAKE 1 TABLET ONCE DAILY (Patient not taking: No sig reported)   No facility-administered medications prior to visit.    Review of Systems  Constitutional: Negative for appetite change, chills and fever.  Respiratory: Negative for chest tightness, shortness of breath and wheezing.   Cardiovascular: Negative for chest pain and palpitations.  Gastrointestinal: Negative for abdominal pain, nausea and vomiting.       Objective    BP 110/68 (BP Location: Right Arm, Patient Position: Sitting, Cuff Size: Normal)   Pulse 70   Temp 99.4 F (37.4 C) (Temporal)   Resp 16   Wt 204 lb (92.5 kg)   BMI 27.67 kg/m  BP Readings from Last 3 Encounters:  06/21/20 110/68  02/21/20 127/80  01/23/20 115/75   Wt Readings from Last 3 Encounters:  06/21/20 204 lb (92.5 kg)  02/21/20 207 lb (93.9 kg)  01/23/20 207 lb (93.9 kg)      Physical Exam Vitals reviewed.  Constitutional:      Appearance: He is well-developed.  HENT:     Head: Normocephalic and atraumatic.  Eyes:     General: No scleral icterus.    Conjunctiva/sclera: Conjunctivae normal.  Neck:     Thyroid: No  thyromegaly.  Cardiovascular:     Rate and Rhythm: Normal rate and regular rhythm.     Heart sounds: Normal heart sounds.  Pulmonary:     Effort: Pulmonary effort is normal.     Breath sounds: Normal breath sounds.  Abdominal:     Palpations: Abdomen is soft.  Musculoskeletal:     Right lower leg: No edema.     Left lower leg: No edema.  Skin:    General: Skin is warm and dry.  Neurological:     General: No focal deficit present.     Mental Status: He is alert and oriented to person, place, and time.     Comments: Normal monofilament exam of feet.  Psychiatric:        Mood and Affect: Mood normal.        Behavior: Behavior normal.        Thought Content: Thought content normal.        Judgment: Judgment normal.       Results for orders placed or performed in visit on 06/21/20  POCT HgB A1C  Result Value Ref Range   Hemoglobin A1C 5.6 4.0 - 5.6 %   Est. average glucose Bld gHb Est-mCnc 114     Assessment & Plan     1. Type 2 diabetes mellitus without complication, without long-term current use of insulin (HCC) Well controlled. Continue current medications. He is overdue for eye exam.  He plans to have this done once he obtains health insurance.  A1c is now 5.6 with a significant weight loss that he has done. He was 260 pounds 2 years ago and he is now 10.  He is now prediabetic.  Follow-up 6 months.  CPE then. - POCT HgB A1C 2.  Autism Return in about 4 months (around 10/20/2020) for CPE.         Lyanna Blystone Cranford Mon, MD  [facility] [phone] (phone) [phone] (fax)  [place]

## 2020-06-21 ENCOUNTER — Encounter: Payer: Self-pay | Admitting: Family Medicine

## 2020-06-21 ENCOUNTER — Other Ambulatory Visit: Payer: Self-pay

## 2020-06-21 ENCOUNTER — Ambulatory Visit (INDEPENDENT_AMBULATORY_CARE_PROVIDER_SITE_OTHER): Payer: Self-pay | Admitting: Family Medicine

## 2020-06-21 VITALS — BP 110/68 | HR 70 | Temp 99.4°F | Resp 16 | Wt 204.0 lb

## 2020-06-21 DIAGNOSIS — F840 Autistic disorder: Secondary | ICD-10-CM

## 2020-06-21 DIAGNOSIS — E119 Type 2 diabetes mellitus without complications: Secondary | ICD-10-CM

## 2020-06-21 LAB — POCT GLYCOSYLATED HEMOGLOBIN (HGB A1C)
Est. average glucose Bld gHb Est-mCnc: 114
Hemoglobin A1C: 5.6 % (ref 4.0–5.6)

## 2020-07-12 ENCOUNTER — Other Ambulatory Visit: Payer: Self-pay

## 2020-07-12 DIAGNOSIS — Z20822 Contact with and (suspected) exposure to covid-19: Secondary | ICD-10-CM

## 2020-07-13 ENCOUNTER — Other Ambulatory Visit: Payer: Self-pay | Admitting: Family Medicine

## 2020-07-13 DIAGNOSIS — E119 Type 2 diabetes mellitus without complications: Secondary | ICD-10-CM

## 2020-07-17 LAB — NOVEL CORONAVIRUS, NAA: SARS-CoV-2, NAA: NOT DETECTED

## 2020-09-09 ENCOUNTER — Other Ambulatory Visit: Payer: Self-pay

## 2020-09-09 ENCOUNTER — Emergency Department
Admission: EM | Admit: 2020-09-09 | Discharge: 2020-09-10 | Disposition: A | Discharge status: Home / Self Care | Payer: BC Managed Care – PPO | Attending: Emergency Medicine | Admitting: Emergency Medicine

## 2020-09-09 DIAGNOSIS — J029 Acute pharyngitis, unspecified: Secondary | ICD-10-CM | POA: Diagnosis present

## 2020-09-09 DIAGNOSIS — U071 COVID-19: Secondary | ICD-10-CM | POA: Insufficient documentation

## 2020-09-09 DIAGNOSIS — R110 Nausea: Secondary | ICD-10-CM | POA: Insufficient documentation

## 2020-09-09 DIAGNOSIS — E119 Type 2 diabetes mellitus without complications: Secondary | ICD-10-CM | POA: Diagnosis not present

## 2020-09-09 DIAGNOSIS — B349 Viral infection, unspecified: Secondary | ICD-10-CM | POA: Insufficient documentation

## 2020-09-09 DIAGNOSIS — Z7984 Long term (current) use of oral hypoglycemic drugs: Secondary | ICD-10-CM | POA: Diagnosis not present

## 2020-09-09 LAB — POC SARS CORONAVIRUS 2 AG -  ED: SARS Coronavirus 2 Ag: POSITIVE — AB

## 2020-09-09 NOTE — ED Triage Notes (Addendum)
Pt states sore throat and nausea since last night. Pt states today with headache, cough and diarrhea. Pt states only one episode or diarrhea. Pt appears in no acute distress.

## 2020-09-09 NOTE — ED Notes (Signed)
FIRST NURSE NOTE: ambulatory to desk c/o nausea, headache, and sore throat ongoing for the last few days. Pt alert and oriented X4, cooperative, RR even and unlabored, color WNL. Pt in NAD.

## 2020-09-10 ENCOUNTER — Encounter: Payer: Self-pay | Admitting: Adult Health

## 2020-09-10 ENCOUNTER — Telehealth: Payer: Self-pay

## 2020-09-10 ENCOUNTER — Other Ambulatory Visit: Payer: Self-pay | Admitting: Adult Health

## 2020-09-10 DIAGNOSIS — U071 COVID-19: Secondary | ICD-10-CM

## 2020-09-10 LAB — CBC WITH DIFFERENTIAL/PLATELET
Abs Immature Granulocytes: 0.01 10*3/uL (ref 0.00–0.07)
Basophils Absolute: 0 10*3/uL (ref 0.0–0.1)
Basophils Relative: 0 %
Eosinophils Absolute: 0 10*3/uL (ref 0.0–0.5)
Eosinophils Relative: 0 %
HCT: 44.9 % (ref 39.0–52.0)
Hemoglobin: 15.6 g/dL (ref 13.0–17.0)
Immature Granulocytes: 0 %
Lymphocytes Relative: 15 %
Lymphs Abs: 0.9 10*3/uL (ref 0.7–4.0)
MCH: 31.1 pg (ref 26.0–34.0)
MCHC: 34.7 g/dL (ref 30.0–36.0)
MCV: 89.4 fL (ref 80.0–100.0)
Monocytes Absolute: 0.8 10*3/uL (ref 0.1–1.0)
Monocytes Relative: 14 %
Neutro Abs: 4.1 10*3/uL (ref 1.7–7.7)
Neutrophils Relative %: 71 %
Platelets: 176 10*3/uL (ref 150–400)
RBC: 5.02 MIL/uL (ref 4.22–5.81)
RDW: 12.9 % (ref 11.5–15.5)
WBC: 5.8 10*3/uL (ref 4.0–10.5)
nRBC: 0 % (ref 0.0–0.2)

## 2020-09-10 LAB — BASIC METABOLIC PANEL
Anion gap: 7 (ref 5–15)
BUN: 15 mg/dL (ref 6–20)
CO2: 26 mmol/L (ref 22–32)
Calcium: 8.3 mg/dL — ABNORMAL LOW (ref 8.9–10.3)
Chloride: 105 mmol/L (ref 98–111)
Creatinine, Ser: 1.23 mg/dL (ref 0.61–1.24)
GFR, Estimated: >60 mL/min (ref >60)
Glucose, Bld: 82 mg/dL (ref 70–99)
Potassium: 3.6 mmol/L (ref 3.5–5.1)
Sodium: 138 mmol/L (ref 135–145)

## 2020-09-10 MED ORDER — ONDANSETRON 4 MG PO TBDP: 4.0000 mg | ORAL_TABLET | Freq: Three times a day (TID) | ORAL | 0 refills | Status: AC | PRN

## 2020-09-10 MED ORDER — PANTOPRAZOLE SODIUM 40 MG IV SOLR
40.0000 mg | Freq: Once | INTRAVENOUS | Status: AC
  Administered 2020-09-10: 40 mg via INTRAVENOUS
  Filled 2020-09-10: qty 40

## 2020-09-10 MED ORDER — ONDANSETRON HCL 4 MG/2ML IJ SOLN
4.0000 mg | Freq: Once | INTRAMUSCULAR | Status: AC
  Administered 2020-09-10: 4 mg via INTRAVENOUS
  Filled 2020-09-10: qty 2

## 2020-09-10 MED ORDER — NIRMATRELVIR/RITONAVIR (PAXLOVID)TABLET: 3.0000 | ORAL_TABLET | Freq: Two times a day (BID) | ORAL | 0 refills | Status: AC

## 2020-09-10 MED ORDER — LACTATED RINGERS IV BOLUS
1000.0000 mL | Freq: Once | INTRAVENOUS | Status: AC
  Administered 2020-09-10: 1000 mL via INTRAVENOUS

## 2020-09-10 NOTE — Discharge Instructions (Signed)
Take zofran as needed for nausea.  You can take immodium for diarrhea as needed as well.

## 2020-09-10 NOTE — Progress Notes (Signed)
Outpatient Oral COVID Treatment Note  I connected with Connor Berger on 09/10/2020/10:51 AM by telephone and verified that I am speaking with the correct person using two identifiers.  I discussed the limitations, risks, security, and privacy concerns of performing an evaluation and management service by telephone and the availability of in person appointments. I also discussed with the patient that there may be a patient responsible charge related to this service. The patient expressed understanding and agreed to proceed.  Patient location: home  Provider location: home  Diagnosis: COVID-19 infection  Purpose of visit: Discussion of potential use of Molnupiravir or Paxlovid, a new treatment for mild to moderate COVID-19 viral infection in non-hospitalized patients.   Subjective: Patient is a 45 y.o. male who has been diagnosed with COVID 19 viral infection.  Their symptoms began on 09/08/2020 with cough/congestion.    Past Medical History:  Diagnosis Date  . Diabetes ([place])     No Known Allergies   Current Outpatient Medications:  .  nirmatrelvir/ritonavir EUA (PAXLOVID) TABS, Take 3 tablets by mouth 2 (two) times daily for 5 days. Patient GFR is greater than 60. Take nirmatrelvir (150 mg) two tablet(s) twice daily for 5 days and ritonavir (100 mg) one tablet twice daily for 5 days., Disp: 30 tablet, Rfl: 0 .  blood glucose meter kit and supplies, Dispense based on patient and insurance preference. Use once daily as directed. (FOR ICD-E11.9). (Patient taking differently: Dispense based on patient and insurance preference. Use once daily as directed. (FOR ICD-E11.9). uses ONE TOUCh), Disp: 1 each, Rfl: 0 .  glucose blood (FREESTYLE LITE) test strip, Check sugar once daily, DX E11.9, Disp: 100 each, Rfl: 12 .  Lancets (FREESTYLE) lancets, Check sugar once daily DX E11.9, Disp: 100 each, Rfl: 12 .  metFORMIN (GLUCOPHAGE) 1000 MG tablet, TAKE 1 TABLET BY MOUTH ONCE DAILY WITH DINNER., Disp: 30  tablet, Rfl: 3 .  Multiple Vitamin (MULTIVITAMIN) tablet, Take 1 tablet by mouth daily., Disp: , Rfl:  .  ondansetron (ZOFRAN ODT) 4 MG disintegrating tablet, Take 1 tablet (4 mg total) by mouth every 8 (eight) hours as needed for nausea or vomiting., Disp: 20 tablet, Rfl: 0 .  rosuvastatin (CRESTOR) 10 MG tablet, TAKE 1 TABLET ONCE DAILY (Patient not taking: No sig reported), Disp: 30 tablet, Rfl: 0  Objective: Patient sounds well.  They are in no apparent distress.  Breathing is non labored.  Mood and behavior are normal.  Laboratory Data:  Recent Results (from the past 2160 hour(s))  POCT HgB A1C     Status: None   Collection Time: 06/21/20 10:33 AM  Result Value Ref Range   Hemoglobin A1C 5.6 4.0 - 5.6 %   Est. average glucose Bld gHb Est-mCnc 114   Novel Coronavirus, NAA ([facility])     Status: None   Collection Time: 07/12/20  6:53 PM   Specimen: Nasopharyngeal(NP) swabs in vial transport medium   Nasopharynge  Result Value Ref Range   SARS-CoV-2, NAA Not Detected Not Detected    Comment: This nucleic acid amplification test was developed and its performance characteristics determined by [employer]. Nucleic acid amplification tests include RT-PCR and TMA. This test has not been FDA cleared or approved. This test has been authorized by FDA under an Emergency Use Authorization (EUA). This test is only authorized for the duration of time the declaration that circumstances exist justifying the authorization of the emergency use of in vitro diagnostic tests for detection of SARS-CoV-2 virus and/or diagnosis of COVID-19  infection under section 564(b)(1) of the Act, 21 U.S.C. 675FFM-3(W) (1), unless the authorization is terminated or revoked sooner. When diagnostic testing is negative, the possibility of a false negative result should be considered in the context of a patient's recent exposures and the presence of clinical signs and symptoms consistent with COVID-19. An  individual without symptoms of COVID-19 and who is not shedding SARS-CoV-2 virus wo uld expect to have a negative (not detected) result in this assay.   POC SARS Coronavirus 2 Ag-ED - Nasal Swab     Status: Abnormal   Collection Time: 09/09/20 11:57 PM  Result Value Ref Range   SARS Coronavirus 2 Ag Positive (A) Negative  Basic metabolic panel     Status: Abnormal   Collection Time: 09/10/20 12:53 AM  Result Value Ref Range   Sodium 138 135 - 145 mmol/L   Potassium 3.6 3.5 - 5.1 mmol/L   Chloride 105 98 - 111 mmol/L   CO2 26 22 - 32 mmol/L   Glucose, Bld 82 70 - 99 mg/dL    Comment: Glucose reference range applies only to samples taken after fasting for at least 8 hours.   BUN 15 6 - 20 mg/dL   Creatinine, Ser 1.23 0.61 - 1.24 mg/dL   Calcium 8.3 (L) 8.9 - 10.3 mg/dL   GFR, Estimated >60 >60 mL/min    Comment: (NOTE) Calculated using the CKD-EPI Creatinine Equation (2021)    Anion gap 7 5 - 15    Comment: Performed at [facility], [address] [place], [place] [address]  CBC with Differential     Status: None   Collection Time: 09/10/20 12:53 AM  Result Value Ref Range   WBC 5.8 4.0 - 10.5 K/uL   RBC 5.02 4.22 - 5.81 MIL/uL   Hemoglobin 15.6 13.0 - 17.0 g/dL   HCT 44.9 39.0 - 52.0 %   MCV 89.4 80.0 - 100.0 fL   MCH 31.1 26.0 - 34.0 pg   MCHC 34.7 30.0 - 36.0 g/dL   RDW 12.9 11.5 - 15.5 %   Platelets 176 150 - 400 K/uL   nRBC 0.0 0.0 - 0.2 %   Neutrophils Relative % 71 %   Neutro Abs 4.1 1.7 - 7.7 K/uL   Lymphocytes Relative 15 %   Lymphs Abs 0.9 0.7 - 4.0 K/uL   Monocytes Relative 14 %   Monocytes Absolute 0.8 0.1 - 1.0 K/uL   Eosinophils Relative 0 %   Eosinophils Absolute 0.0 0.0 - 0.5 K/uL   Basophils Relative 0 %   Basophils Absolute 0.0 0.0 - 0.1 K/uL   Immature Granulocytes 0 %   Abs Immature Granulocytes 0.01 0.00 - 0.07 K/uL    Comment: Performed at [facility], [address] [place], [place] [address]     Assessment: 45 y.o.  male with mild/moderate COVID 19 viral infection diagnosed on 09/09/2020 at high risk for progression to severe COVID 19.  Plan:  This patient is a 45 y.o. male that meets the following criteria for Emergency Use Authorization of: Paxlovid 1. Age >12 yr AND > 40 kg 2. SARS-COV-2 positive test 3. Symptom onset < 5 days 4. Mild-to-moderate COVID disease with high risk for severe progression to hospitalization or death  I have spoken and communicated the following to the patient or parent/caregiver regarding: 1. Paxlovid is an unapproved drug that is authorized for use under an Emergency Use Authorization.  2. There are no adequate, approved, available products for the  treatment of COVID-19 in adults who have mild-to-moderate COVID-19 and are at high risk for progressing to severe COVID-19, including hospitalization or death. 3. Other therapeutics are currently authorized. For additional information on all products authorized for treatment or prevention of COVID-19, please see [email or web address].  4. There are benefits and risks of taking this treatment as outlined in the "Fact Sheet for Patients and Caregivers."  5. "Fact Sheet for Patients and Caregivers" was reviewed with patient. A hard copy will be provided to patient from pharmacy prior to the patient receiving treatment. 6. Patients should continue to self-isolate and use infection control measures (e.g., wear mask, isolate, social distance, avoid sharing personal items, clean and disinfect "high touch" surfaces, and frequent handwashing) according to CDC guidelines.  7. The patient or parent/caregiver has the option to accept or refuse treatment. 8. Patient medication history was reviewed for potential drug interactions:Interaction with home meds: I recommended that he hold the Crestor starting today, and to stay off of it for the next week.   I reviewed the interaction between paxlovid with his Metformin and recommended that he check his blood sugars twice per day and notify his PCP for elevated blood sugars.   9. Patient's GFR was calculated to be greater than 60 on 09/09/2020, and they were therefore prescribed Normal dose (GFR>60) - nirmatrelvir 162m tab (2 tablet) by mouth twice daily AND ritonavir 1076mtab (1 tablet) by mouth twice daily   After reviewing above information with the patient, the patient agrees to receive Paxlovid.  Follow up instructions:    . Take prescription BID x 5 days as directed . Reach out to pharmacist for counseling on medication if desired . For concerns regarding further COVID symptoms please follow up with your PCP or urgent care . For urgent or life-threatening issues, seek care at your local emergency department  The patient was provided an opportunity to ask questions, and all were answered. The patient agreed with the plan and demonstrated an understanding of the instructions.   Script sent to [facility] opted to pick up RX.  The patient was advised to call their PCP or seek an in-person evaluation if the symptoms worsen or if the condition fails to improve as anticipated.   I provided 15 minutes of non face-to-face telephone visit time during this encounter, and > 50% was spent counseling as documented under my assessment & plan.  LiScot DockNP 09/10/2020 /10:51 AM

## 2020-09-10 NOTE — ED Provider Notes (Signed)
[facility] Emergency Department Provider Note  ____________________________________________  Time seen: Approximately 2:10 AM  I have reviewed the triage vital signs and the nursing notes.   HISTORY  Chief Complaint headache, Sore Throat, and nausea    HPI Connor Berger is a 45 y.o. male with a history of diabetes on Metformin who comes ED complaining of nausea, generalized headache, sore throat, fatigue, diarrhea for the past 2 days.  He has been without his Metformin and glucometer for the past few days as well.  He has had decreased oral intake but is still tolerating fluids.  Symptoms are constant, waxing waning, no aggravating or alleviating factors.      Past Medical History:  Diagnosis Date  . Diabetes [facility])      Patient Active Problem List   Diagnosis Date Noted  . Type 2 diabetes mellitus with complication, with long-term current use of insulin ([place]) 01/26/2020  . 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 MOLE REMOVAL     not sure if it was cancerous  . MOUTH SURGERY       Prior to Admission medications   Medication Sig Start Date End Date Taking? Authorizing Provider  ondansetron (ZOFRAN ODT) 4 MG disintegrating tablet Take 1 tablet (4 mg total) by mouth every 8 (eight) hours as needed for nausea or vomiting. 09/10/20  Yes Carrie Mew, MD  blood glucose meter kit and supplies Dispense based on patient and insurance preference. Use once daily as directed. (FOR ICD-E11.9). Patient taking differently: Dispense based on patient and insurance preference. Use once daily as directed. (FOR ICD-E11.9). uses ONE TOUCh 05/26/19   Jerrol Banana., MD  glucose blood (FREESTYLE LITE) test strip Check sugar once daily, DX E11.9 04/06/17   Jerrol Banana., MD  Lancets (FREESTYLE) lancets Check sugar once daily DX E11.9  04/06/17   Jerrol Banana., MD  metFORMIN (GLUCOPHAGE) 1000 MG tablet TAKE 1 TABLET BY MOUTH ONCE DAILY WITH DINNER. 07/13/20   Jerrol Banana., MD  Multiple Vitamin (MULTIVITAMIN) tablet Take 1 tablet by mouth daily.    [provider]  rosuvastatin (CRESTOR) 10 MG tablet TAKE 1 TABLET ONCE DAILY Patient not taking: No sig reported 07/08/18   Jerrol Banana., MD     Allergies Patient has no known allergies.   Family History  Problem Relation Age of Onset  . Diabetes Father   . Diabetes Maternal Grandmother     Social History Social History   Tobacco Use  . Smoking status: Never Smoker  . Smokeless tobacco: Never Used  Vaping Use  . Vaping Use: Never used  Substance Use Topics  . Alcohol use: No  . Drug use: No    Review of Systems  Constitutional:   No fever or chills.  ENT:   No sore throat. No rhinorrhea. Cardiovascular:   No chest pain or syncope. Respiratory:   No dyspnea or cough. Gastrointestinal:   Negative for abdominal pain or vomiting.  Positive diarrhea Musculoskeletal:   Negative for focal pain or swelling All other systems reviewed and are negative except as documented above in ROS and HPI.  ____________________________________________   PHYSICAL EXAM:  VITAL SIGNS: ED Triage Vitals  Enc Vitals Group     BP 09/09/20 2218 105/69     Pulse Rate 09/09/20 2218 100     Resp 09/09/20 2218  16     Temp 09/09/20 2218 100 F (37.8 C)     Temp Source 09/09/20 2218 Oral     SpO2 09/09/20 2218 100 %     Weight 09/09/20 2219 210 lb (95.3 kg)     Height 09/09/20 2219 6' (1.829 m)     Head Circumference --      Peak Flow --      Pain Score 09/09/20 2218 4     Pain Loc --      Pain Edu? --      Excl. in [place]? --     Vital signs reviewed, nursing assessments reviewed.   Constitutional:   Alert and oriented. Non-toxic appearance. Eyes:   Conjunctivae are normal. EOMI. PERRL. ENT      Head:   Normocephalic and atraumatic.       Nose:   Wearing a mask.      Mouth/Throat:   Wearing a mask.      Neck:   No meningismus. Full ROM. Hematological/Lymphatic/Immunilogical:   No cervical lymphadenopathy. Cardiovascular:   RRR. Symmetric bilateral radial and DP pulses.  No murmurs. Cap refill less than 2 seconds. Respiratory:   Normal respiratory effort without tachypnea/retractions. Breath sounds are clear and equal bilaterally. No wheezes/rales/rhonchi. Gastrointestinal:   Soft and nontender. Non distended. There is no CVA tenderness.  No rebound, rigidity, or guarding. Genitourinary:   deferred Musculoskeletal:   Normal range of motion in all extremities. No joint effusions.  No lower extremity tenderness.  No edema. Neurologic:   Normal speech and language.  Motor grossly intact. No acute focal neurologic deficits are appreciated.  Skin:    Skin is warm, dry and intact. No rash noted.  No petechiae, purpura, or bullae.  ____________________________________________    LABS (pertinent positives/negatives) (all labs ordered are listed, but only abnormal results are displayed) Labs Reviewed  BASIC METABOLIC PANEL - Abnormal; Notable for the following components:      Result Value   Calcium 8.3 (*)    All other components within normal limits  POC SARS CORONAVIRUS 2 AG -  ED - Abnormal; Notable for the following components:   SARS Coronavirus 2 Ag Positive (*)    All other components within normal limits  CBC WITH DIFFERENTIAL/PLATELET   ____________________________________________   EKG    ____________________________________________    RADIOLOGY  No results found.  ____________________________________________   PROCEDURES Procedures  ____________________________________________    CLINICAL IMPRESSION / ASSESSMENT AND PLAN / ED COURSE  Medications ordered in the ED: Medications  lactated ringers bolus 1,000 mL (0 mLs Intravenous Stopped 09/10/20 0207)  ondansetron (ZOFRAN) injection 4 mg (4 mg  Intravenous Given 09/10/20 0057)  pantoprazole (PROTONIX) injection 40 mg (40 mg Intravenous Given 09/10/20 0058)    Pertinent labs & imaging results that were available during my care of the patient were reviewed by me and considered in my medical decision making (see chart for details).  Connor Berger was evaluated in Emergency Department on 09/10/2020 for the symptoms described in the history of present illness. He was evaluated in the context of the global COVID-19 pandemic, which necessitated consideration that the patient might be at risk for infection with the SARS-CoV-2 virus that causes COVID-19. Institutional protocols and algorithms that pertain to the evaluation of patients at risk for COVID-19 are in a state of rapid change based on information released by regulatory bodies including the CDC and federal and state organizations. These policies and algorithms were followed during the patient's care  in the ED.   Patient presents with influenza-like illness.  Covid antigen test is positive.  Vital signs are normal, he is nontoxic, will refer to outpatient Covid treatment clinic.  Because of his diabetes, recent noncompliance with glucose checks and Metformin, check labs to ensure he does not have dehydration or ketoacidosis.  These are normal and he can be discharged      ____________________________________________   FINAL CLINICAL IMPRESSION(S) / ED DIAGNOSES    Final diagnoses:  COVID-19 virus infection  Viral syndrome     ED Discharge Orders         Ordered    Ambulatory referral for Covid Treatment        09/10/20 0209    ondansetron (ZOFRAN ODT) 4 MG disintegrating tablet  Every 8 hours PRN        09/10/20 0209          Portions of this note were generated with dragon dictation software. Dictation errors may occur despite best attempts at proofreading.   Carrie Mew, MD 09/10/20 [id number]

## 2020-09-10 NOTE — Telephone Encounter (Signed)
Called to discuss with patient about COVID-19 symptoms and the use of one of the available treatments for those with mild to moderate Covid symptoms and at a high risk of hospitalization.  Pt appears to qualify for outpatient treatment due to co-morbid conditions and/or a member of an at-risk group in accordance with the FDA Emergency Use Authorization.    Symptom onset: 09/07/20 Vaccinated: Yes Booster? No Immunocompromised? No Qualifiers: DM 2  Pt. Would like to speak with APP.  Marcello Moores

## 2020-09-19 ENCOUNTER — Telehealth (INDEPENDENT_AMBULATORY_CARE_PROVIDER_SITE_OTHER): Payer: BC Managed Care – PPO | Admitting: Family Medicine

## 2020-09-19 DIAGNOSIS — U071 COVID-19: Secondary | ICD-10-CM

## 2020-09-19 DIAGNOSIS — F840 Autistic disorder: Secondary | ICD-10-CM

## 2020-09-19 DIAGNOSIS — R110 Nausea: Secondary | ICD-10-CM | POA: Diagnosis not present

## 2020-09-19 DIAGNOSIS — R059 Cough, unspecified: Secondary | ICD-10-CM

## 2020-09-19 DIAGNOSIS — E119 Type 2 diabetes mellitus without complications: Secondary | ICD-10-CM

## 2020-09-19 NOTE — Progress Notes (Signed)
MyChart Video Visit    Virtual Visit via Video Note   This visit type was conducted due to national recommendations for restrictions regarding the COVID-19 Pandemic (e.g. social distancing) in an effort to limit this patient's exposure and mitigate transmission in our community. This patient is at least at moderate risk for complications without adequate follow up. This format is felt to be most appropriate for this patient at this time. Physical exam was limited by quality of the video and audio technology used for the visit.   Patient location: Home Provider location: Office  I discussed the limitations of evaluation and management by telemedicine and the availability of in person appointments. The patient expressed understanding and agreed to proceed.  Patient: Connor Berger   DOB: [birth year]   45 y.o. Male  MRN: [id number] Visit Date: 09/19/2020  Today's healthcare provider: Wilhemena Durie, MD   Chief Complaint  Patient presents with  . Covid Positive   Subjective    HPI  Patient virtual visit today for follow-up after waking up on March 5 with a sore throat and headache and then developed nausea some diarrhea and went to the emergency department and was found to have a positive Covid test.  He had some mild cough but is been treated with Zofran which helps his nausea.  He was told he need to stay out of work for 5 days but for some reason went to get a Covid test yesterday at [facility] which is pending.  He now calls asking if it is okay to go back to work.  Other than minimal nausea minimal cough his symptoms of resolved.  He has no dyspnea.     Medications: Outpatient Medications Prior to Visit  Medication Sig  . blood glucose meter kit and supplies Dispense based on patient and insurance preference. Use once daily as directed. (FOR ICD-E11.9). (Patient taking differently: Dispense based on patient and insurance preference. Use once daily as directed. (FOR ICD-E11.9). uses  ONE TOUCh)  . glucose blood (FREESTYLE LITE) test strip Check sugar once daily, DX E11.9  . Lancets (FREESTYLE) lancets Check sugar once daily DX E11.9  . metFORMIN (GLUCOPHAGE) 1000 MG tablet TAKE 1 TABLET BY MOUTH ONCE DAILY WITH DINNER.  . Multiple Vitamin (MULTIVITAMIN) tablet Take 1 tablet by mouth daily.  . ondansetron (ZOFRAN ODT) 4 MG disintegrating tablet Take 1 tablet (4 mg total) by mouth every 8 (eight) hours as needed for nausea or vomiting.  . rosuvastatin (CRESTOR) 10 MG tablet TAKE 1 TABLET ONCE DAILY (Patient not taking: No sig reported)   No facility-administered medications prior to visit.    Review of Systems  Constitutional: Negative for appetite change, chills and fever.  Respiratory: Negative for chest tightness, shortness of breath and wheezing.   Cardiovascular: Negative for chest pain and palpitations.  Gastrointestinal: Negative for abdominal pain, nausea and vomiting.       Objective    There were no vitals taken for this visit.    Physical Exam  He is alert and is in no distress today.  He is breathing well.   Assessment & Plan     1. COVID-19 I think patient is safe to go back to work.  He cannot really tell me why he went to get the Covid test yesterday. Have advised him to wear a mask 2. Nausea Resolving and due to Covid  3. Cough Resolving  4. Autism   5. Type 2 diabetes mellitus without complication, without long-term current  use of insulin ([place]) Follow-up when appropriate and he is to continue to work on diet and exercise.   No follow-ups on file.     I discussed the assessment and treatment plan with the patient. The patient was provided an opportunity to ask questions and all were answered. The patient agreed with the plan and demonstrated an understanding of the instructions.   The patient was advised to call back or seek an in-person evaluation if the symptoms worsen or if the condition fails to improve as anticipated.  I  provided 11 minutes of non-face-to-face time during this encounter.  I, Wilhemena Durie, MD, have reviewed all documentation for this visit. The documentation on 09/20/20 for the exam, diagnosis, procedures, and orders are all accurate and complete.   Richard Cranford Mon, MD [facility] [phone] (phone) [phone] (fax)  [place]

## 2020-09-25 ENCOUNTER — Ambulatory Visit: Payer: Self-pay

## 2020-10-23 ENCOUNTER — Encounter: Payer: Self-pay | Admitting: Family Medicine

## 2020-11-26 ENCOUNTER — Emergency Department
Admission: EM | Admit: 2020-11-26 | Discharge: 2020-11-26 | Disposition: A | Discharge status: Home / Self Care | Payer: BC Managed Care – PPO | Attending: Emergency Medicine | Admitting: Emergency Medicine

## 2020-11-26 ENCOUNTER — Encounter: Payer: Self-pay | Admitting: Emergency Medicine

## 2020-11-26 ENCOUNTER — Other Ambulatory Visit: Payer: Self-pay

## 2020-11-26 DIAGNOSIS — M542 Cervicalgia: Secondary | ICD-10-CM | POA: Diagnosis present

## 2020-11-26 DIAGNOSIS — Y92410 Unspecified street and highway as the place of occurrence of the external cause: Secondary | ICD-10-CM | POA: Insufficient documentation

## 2020-11-26 DIAGNOSIS — Z5321 Procedure and treatment not carried out due to patient leaving prior to being seen by health care provider: Secondary | ICD-10-CM | POA: Insufficient documentation

## 2020-11-26 NOTE — ED Notes (Signed)
No answer when called several times from lobby 

## 2020-11-26 NOTE — ED Notes (Signed)
No answer when called several times from lobby, no answer when phone # listed in chart called as well (voice box full)
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
## 2020-11-27 ENCOUNTER — Emergency Department: Payer: BC Managed Care – PPO

## 2020-11-27 ENCOUNTER — Encounter: Payer: Self-pay | Admitting: Emergency Medicine

## 2020-11-27 ENCOUNTER — Emergency Department
Admission: EM | Admit: 2020-11-27 | Discharge: 2020-11-27 | Disposition: A | Discharge status: Home / Self Care | Payer: BC Managed Care – PPO | Attending: Emergency Medicine | Admitting: Emergency Medicine

## 2020-11-27 ENCOUNTER — Other Ambulatory Visit: Payer: Self-pay

## 2020-11-27 DIAGNOSIS — Z7984 Long term (current) use of oral hypoglycemic drugs: Secondary | ICD-10-CM | POA: Insufficient documentation

## 2020-11-27 DIAGNOSIS — E119 Type 2 diabetes mellitus without complications: Secondary | ICD-10-CM | POA: Insufficient documentation

## 2020-11-27 DIAGNOSIS — Y92410 Unspecified street and highway as the place of occurrence of the external cause: Secondary | ICD-10-CM | POA: Diagnosis not present

## 2020-11-27 DIAGNOSIS — S161XXA Strain of muscle, fascia and tendon at neck level, initial encounter: Secondary | ICD-10-CM | POA: Diagnosis not present

## 2020-11-27 DIAGNOSIS — S199XXA Unspecified injury of neck, initial encounter: Secondary | ICD-10-CM | POA: Diagnosis present

## 2020-11-27 MED ORDER — NAPROXEN 500 MG PO TABS: 500.0000 mg | ORAL_TABLET | Freq: Two times a day (BID) | ORAL | 0 refills | Status: AC

## 2020-11-27 MED ORDER — ORPHENADRINE CITRATE ER 100 MG PO TB12: 100.0000 mg | ORAL_TABLET | Freq: Two times a day (BID) | ORAL | 0 refills | Status: AC
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
## 2020-11-27 NOTE — Discharge Instructions (Signed)
Read and follow discharge care instructions. 

## 2020-12-08 ENCOUNTER — Emergency Department
Admission: EM | Admit: 2020-12-08 | Discharge: 2020-12-09 | Disposition: A | Discharge status: Home / Self Care | Payer: BC Managed Care – PPO | Attending: Emergency Medicine | Admitting: Emergency Medicine

## 2020-12-08 ENCOUNTER — Emergency Department: Payer: BC Managed Care – PPO

## 2020-12-08 ENCOUNTER — Other Ambulatory Visit: Payer: Self-pay

## 2020-12-08 DIAGNOSIS — R5383 Other fatigue: Secondary | ICD-10-CM | POA: Insufficient documentation

## 2020-12-08 DIAGNOSIS — Z20822 Contact with and (suspected) exposure to covid-19: Secondary | ICD-10-CM | POA: Insufficient documentation

## 2020-12-08 DIAGNOSIS — E119 Type 2 diabetes mellitus without complications: Secondary | ICD-10-CM | POA: Insufficient documentation

## 2020-12-08 DIAGNOSIS — Z7984 Long term (current) use of oral hypoglycemic drugs: Secondary | ICD-10-CM | POA: Insufficient documentation

## 2020-12-08 DIAGNOSIS — F419 Anxiety disorder, unspecified: Secondary | ICD-10-CM | POA: Insufficient documentation

## 2020-12-08 DIAGNOSIS — R197 Diarrhea, unspecified: Secondary | ICD-10-CM | POA: Insufficient documentation

## 2020-12-08 DIAGNOSIS — B349 Viral infection, unspecified: Secondary | ICD-10-CM | POA: Insufficient documentation

## 2020-12-08 DIAGNOSIS — Z560 Unemployment, unspecified: Secondary | ICD-10-CM | POA: Insufficient documentation

## 2020-12-08 LAB — CBC WITH DIFFERENTIAL/PLATELET
Abs Immature Granulocytes: 0.01 10*3/uL (ref 0.00–0.07)
Basophils Absolute: 0.1 10*3/uL (ref 0.0–0.1)
Basophils Relative: 1 %
Eosinophils Absolute: 0.1 10*3/uL (ref 0.0–0.5)
Eosinophils Relative: 2 %
HCT: 41.4 % (ref 39.0–52.0)
Hemoglobin: 14.7 g/dL (ref 13.0–17.0)
Immature Granulocytes: 0 %
Lymphocytes Relative: 32 %
Lymphs Abs: 2.4 10*3/uL (ref 0.7–4.0)
MCH: 31 pg (ref 26.0–34.0)
MCHC: 35.5 g/dL (ref 30.0–36.0)
MCV: 87.3 fL (ref 80.0–100.0)
Monocytes Absolute: 0.5 10*3/uL (ref 0.1–1.0)
Monocytes Relative: 6 %
Neutro Abs: 4.5 10*3/uL (ref 1.7–7.7)
Neutrophils Relative %: 59 %
Platelets: 227 10*3/uL (ref 150–400)
RBC: 4.74 MIL/uL (ref 4.22–5.81)
RDW: 13.4 % (ref 11.5–15.5)
WBC: 7.5 10*3/uL (ref 4.0–10.5)
nRBC: 0 % (ref 0.0–0.2)

## 2020-12-08 MED ORDER — SODIUM CHLORIDE 0.9 % IV BOLUS
1000.0000 mL | Freq: Once | INTRAVENOUS | Status: AC
  Administered 2020-12-09: 1000 mL via INTRAVENOUS

## 2020-12-08 NOTE — ED Provider Notes (Signed)
[facility] Emergency Department Provider Note   ____________________________________________   Event Date/Time   First MD Initiated Contact with Patient 12/08/20 2352     (approximate)  I have reviewed the triage vital signs and the nursing notes.   HISTORY  Chief Complaint Headache, Diarrhea, and Fatigue    HPI Connor Berger is a 45 y.o. male who presents to the ED from home with a chief complaint of headache, diarrhea, fatigue requesting COVID test. Complains of gradual onset generalized mild headache, feeling fatigued and some loose stools.  Patient has a history of diabetes.  Contracted COVID-19 in March of this year.  He is vaccinated but not yet boosted.  Had a cough 2 weeks ago and tested negative for COVID.  Denies fever, cough, chest pain, shortness of breath, abdominal pain, nausea or vomiting.     Past Medical History:  Diagnosis Date  . Diabetes [facility])     Patient Active Problem List   Diagnosis Date Noted  . Type 2 diabetes mellitus with complication, with long-term current use of insulin ([place]) 01/26/2020  . 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 MOLE REMOVAL     not sure if it was cancerous  . MOUTH SURGERY      Prior to Admission medications   Medication Sig Start Date End Date Taking? Authorizing Provider  blood glucose meter kit and supplies Dispense based on patient and insurance preference. Use once daily as directed. (FOR ICD-E11.9). Patient taking differently: Dispense based on patient and insurance preference. Use once daily as directed. (FOR ICD-E11.9). uses ONE TOUCh 05/26/19   Jerrol Banana., MD  glucose blood (FREESTYLE LITE) test strip Check sugar once daily, DX E11.9 04/06/17   Jerrol Banana., MD  Lancets (FREESTYLE) lancets Check sugar once daily DX E11.9 04/06/17   Jerrol Banana., MD  metFORMIN (GLUCOPHAGE) 1000 MG tablet TAKE 1 TABLET BY MOUTH ONCE DAILY WITH DINNER. 07/13/20   Jerrol Banana., MD  Multiple Vitamin (MULTIVITAMIN) tablet Take 1 tablet by mouth daily.    [provider]  naproxen (NAPROSYN) 500 MG tablet Take 1 tablet (500 mg total) by mouth 2 (two) times daily with a meal. 11/27/20   Sable Feil, PA-C  ondansetron (ZOFRAN ODT) 4 MG disintegrating tablet Take 1 tablet (4 mg total) by mouth every 8 (eight) hours as needed for nausea or vomiting. 09/10/20   Carrie Mew, MD  orphenadrine (NORFLEX) 100 MG tablet Take 1 tablet (100 mg total) by mouth 2 (two) times daily. 11/27/20   Sable Feil, PA-C  rosuvastatin (CRESTOR) 10 MG tablet TAKE 1 TABLET ONCE DAILY Patient not taking: No sig reported 07/08/18   Jerrol Banana., MD    Allergies Patient has no known allergies.  Family History  Problem Relation Age of Onset  . Diabetes Father   . Diabetes Maternal Grandmother     Social History Social History   Tobacco Use  . Smoking status: Never Smoker  . Smokeless tobacco: Never Used  Vaping Use  . Vaping Use: Never used  Substance Use Topics  . Alcohol use: No  . Drug use: No    Review of Systems  Constitutional: Positive for fatigue.  No fever/chills Eyes: No visual changes. ENT: No sore throat. Cardiovascular: Denies chest pain. Respiratory: Denies shortness of breath. Gastrointestinal: No abdominal pain.  No nausea, no vomiting.  No diarrhea.  No constipation. Genitourinary: Negative for dysuria. Musculoskeletal: Negative for back pain. Skin: Negative for rash. Neurological: Positive for headache. Negative for focal weakness or numbness.   ____________________________________________   PHYSICAL EXAM:  VITAL SIGNS: ED Triage Vitals  Enc Vitals Group     BP 12/08/20 2206 114/79     Pulse Rate 12/08/20 2206 81     Resp 12/08/20 2206 17     Temp 12/08/20 2206 98.4 F (36.9 C)     Temp Source  12/08/20 2206 Oral     SpO2 12/08/20 2206 95 %     Weight 12/08/20 2209 167 lb (75.8 kg)     Height 12/08/20 2209 6' (1.829 m)     Head Circumference --      Peak Flow --      Pain Score 12/08/20 2209 4     Pain Loc --      Pain Edu? --      Excl. in [place]? --     Constitutional: Alert and oriented. Well appearing and in no acute distress. Eyes: Conjunctivae are normal. PERRL. EOMI. Head: Atraumatic. Nose: No congestion/rhinnorhea. Mouth/Throat: Mucous membranes are mildly dry.   Neck: No stridor.   Cardiovascular: Normal rate, regular rhythm. Grossly normal heart sounds.  Good peripheral circulation. Respiratory: Normal respiratory effort.  No retractions. Lungs CTAB. Gastrointestinal: Soft and nontender to light or deep palpation. No distention. No abdominal bruits. No CVA tenderness. Musculoskeletal: No lower extremity tenderness nor edema.  No joint effusions. Neurologic:  Normal speech and language. No gross focal neurologic deficits are appreciated. No gait instability. Skin:  Skin is warm, dry and intact. No rash noted.  No petechiae. Psychiatric: Mood and affect are normal. Speech and behavior are normal.  ____________________________________________   LABS (all labs ordered are listed, but only abnormal results are displayed)  Labs Reviewed  RESP PANEL BY RT-PCR (FLU A&B, COVID) [id number]  CBC WITH DIFFERENTIAL/PLATELET  COMPREHENSIVE METABOLIC PANEL  TROPONIN I (HIGH SENSITIVITY)  TROPONIN I (HIGH SENSITIVITY)   ____________________________________________  EKG  ED ECG REPORT I, Yousof Alderman J, the attending physician, personally viewed and interpreted this ECG.   Date: 12/09/2020  EKG Time: 2327  Rate: 65  Rhythm: normal EKG, normal sinus rhythm  Axis: Normal  Intervals:none  ST&T Change: Nonspecific  ____________________________________________  RADIOLOGY I, Leaner Morici J, personally viewed and evaluated these images (plain radiographs) as part of my medical  decision making, as well as reviewing the written report by the radiologist.  ED MD interpretation: No acute cardiopulmonary process  Official radiology report(s): DG Chest 2 View  Result Date: 12/08/2020 CLINICAL DATA:  Headache, diarrhea and fatigue x2 days. EXAM: CHEST - 2 VIEW COMPARISON:  None. FINDINGS: The heart size and mediastinal contours are within normal limits. Both lungs are clear. The visualized skeletal structures are unremarkable. IMPRESSION: No active cardiopulmonary disease. Electronically Signed   By: Virgina Norfolk M.D.   On: 12/08/2020 23:42    ____________________________________________   PROCEDURES  Procedure(s) performed (including Critical Care):  Procedures   ____________________________________________   INITIAL IMPRESSION / ASSESSMENT AND PLAN / ED COURSE  As part of my medical decision making, I reviewed the following data within the [place] notes reviewed and incorporated, Labs reviewed, EKG interpreted, Old chart reviewed, Radiograph reviewed and Notes from prior ED visits     45 year old male presenting with headache, fatigue and diarrhea.  Differential diagnosis includes but is not limited to viral process,  infectious, metabolic, ACS etiologies, etc.  Chest x-ray unremarkable.  Will obtain COVID swab, infuse IV hydration and reassess.  Clinical Course as of 12/09/20 7005  Nancy Fetter Dec 09, 2020  0338 Patient resting in no acute distress, feeling better.  Updated him on all test results.  Advised plenty of fluids, NSAIDs/Tylenol.  Strict return precautions given.  Patient verbalizes understanding agrees with plan of care. [JS]    Clinical Course User Index [JS] Paulette Blanch, MD     ____________________________________________   FINAL CLINICAL IMPRESSION(S) / ED DIAGNOSES  Final diagnoses:  Diarrhea, unspecified type  Viral illness     ED Discharge Orders    None       Note:  This document was prepared  using Dragon voice recognition software and may include unintentional dictation errors.   Paulette Blanch, MD 12/09/20 [phone]

## 2020-12-08 NOTE — ED Triage Notes (Signed)
Pt presents to ER with c/o headache, diarrhea, and fatigue x2 days. Unknown if pt was in contact with anybody who had COVID.  Pt states he tested positive for COVID last in early march of this year.  Pt A&O x4 at this time, and in NAD.

## 2020-12-09 ENCOUNTER — Emergency Department
Admission: EM | Admit: 2020-12-09 | Discharge: 2020-12-09 | Disposition: A | Discharge status: Home / Self Care | Payer: BC Managed Care – PPO | Source: Home / Self Care | Attending: Emergency Medicine | Admitting: Emergency Medicine

## 2020-12-09 ENCOUNTER — Other Ambulatory Visit: Payer: Self-pay

## 2020-12-09 ENCOUNTER — Encounter: Payer: Self-pay | Admitting: Emergency Medicine

## 2020-12-09 DIAGNOSIS — Z7984 Long term (current) use of oral hypoglycemic drugs: Secondary | ICD-10-CM | POA: Insufficient documentation

## 2020-12-09 DIAGNOSIS — R5383 Other fatigue: Secondary | ICD-10-CM

## 2020-12-09 DIAGNOSIS — F419 Anxiety disorder, unspecified: Secondary | ICD-10-CM | POA: Insufficient documentation

## 2020-12-09 DIAGNOSIS — R519 Headache, unspecified: Secondary | ICD-10-CM | POA: Insufficient documentation

## 2020-12-09 DIAGNOSIS — E119 Type 2 diabetes mellitus without complications: Secondary | ICD-10-CM | POA: Insufficient documentation

## 2020-12-09 DIAGNOSIS — R197 Diarrhea, unspecified: Secondary | ICD-10-CM | POA: Insufficient documentation

## 2020-12-09 LAB — COMPREHENSIVE METABOLIC PANEL
ALT: 15 U/L (ref 0–44)
ALT: 15 U/L (ref 0–44)
AST: 24 U/L (ref 15–41)
AST: 25 U/L (ref 15–41)
Albumin: 4.4 g/dL (ref 3.5–5.0)
Albumin: 4.5 g/dL (ref 3.5–5.0)
Alkaline Phosphatase: 50 U/L (ref 38–126)
Alkaline Phosphatase: 49 U/L (ref 38–126)
Anion gap: 8 (ref 5–15)
Anion gap: 9 (ref 5–15)
BUN: 12 mg/dL (ref 6–20)
BUN: 11 mg/dL (ref 6–20)
CO2: 24 mmol/L (ref 22–32)
CO2: 24 mmol/L (ref 22–32)
Calcium: 9.2 mg/dL (ref 8.9–10.3)
Calcium: 9.2 mg/dL (ref 8.9–10.3)
Chloride: 108 mmol/L (ref 98–111)
Chloride: 104 mmol/L (ref 98–111)
Creatinine, Ser: 1.06 mg/dL (ref 0.61–1.24)
Creatinine, Ser: 0.98 mg/dL (ref 0.61–1.24)
GFR, Estimated: >60 mL/min (ref >60)
GFR, Estimated: >60 mL/min (ref >60)
Glucose, Bld: 81 mg/dL (ref 70–99)
Glucose, Bld: 147 mg/dL — ABNORMAL HIGH (ref 70–99)
Potassium: 3.7 mmol/L (ref 3.5–5.1)
Potassium: 3.8 mmol/L (ref 3.5–5.1)
Sodium: 140 mmol/L (ref 135–145)
Sodium: 137 mmol/L (ref 135–145)
Total Bilirubin: 1.2 mg/dL (ref 0.3–1.2)
Total Bilirubin: 1.4 mg/dL — ABNORMAL HIGH (ref 0.3–1.2)
Total Protein: 7 g/dL (ref 6.5–8.1)
Total Protein: 6.7 g/dL (ref 6.5–8.1)

## 2020-12-09 LAB — TROPONIN I (HIGH SENSITIVITY)
Troponin I (High Sensitivity): 8 ng/L (ref <18)
Troponin I (High Sensitivity): 6 ng/L (ref <18)

## 2020-12-09 LAB — CBC WITH DIFFERENTIAL/PLATELET
Abs Immature Granulocytes: 0.01 10*3/uL (ref 0.00–0.07)
Basophils Absolute: 0.1 10*3/uL (ref 0.0–0.1)
Basophils Relative: 2 %
Eosinophils Absolute: 0.1 10*3/uL (ref 0.0–0.5)
Eosinophils Relative: 2 %
HCT: 41.4 % (ref 39.0–52.0)
Hemoglobin: 14.5 g/dL (ref 13.0–17.0)
Immature Granulocytes: 0 %
Lymphocytes Relative: 38 %
Lymphs Abs: 1.7 10*3/uL (ref 0.7–4.0)
MCH: 30.9 pg (ref 26.0–34.0)
MCHC: 35 g/dL (ref 30.0–36.0)
MCV: 88.3 fL (ref 80.0–100.0)
Monocytes Absolute: 0.3 10*3/uL (ref 0.1–1.0)
Monocytes Relative: 7 %
Neutro Abs: 2.3 10*3/uL (ref 1.7–7.7)
Neutrophils Relative %: 51 %
Platelets: 228 10*3/uL (ref 150–400)
RBC: 4.69 MIL/uL (ref 4.22–5.81)
RDW: 13.7 % (ref 11.5–15.5)
WBC: 4.5 10*3/uL (ref 4.0–10.5)
nRBC: 0 % (ref 0.0–0.2)

## 2020-12-09 LAB — RESP PANEL BY RT-PCR (FLU A&B, COVID) ARPGX2
Influenza A by PCR: NEGATIVE
Influenza B by PCR: NEGATIVE
SARS Coronavirus 2 by RT PCR: NEGATIVE

## 2020-12-09 LAB — ETHANOL: Alcohol, Ethyl (B): <10 mg/dL (ref <10)

## 2020-12-09 NOTE — ED Triage Notes (Signed)
Pt in w/request for psych eval. Denies any SI/HI or hallucinations presently. Says he has been going through a lot of stressful life changes recently, and is anxious. Only known hx is DM and autism. Denies any substance abuse

## 2020-12-09 NOTE — Discharge Instructions (Addendum)
You may take Tylenol and/or Ibuprofen as needed for body aches or discomfort.  Drink plenty of fluids daily.  Return to the ER for worsening symptoms, persistent vomiting, difficulty breathing or other concerns.

## 2020-12-09 NOTE — ED Provider Notes (Signed)
[facility] Emergency Department Provider Note  ____________________________________________   Event Date/Time   First MD Initiated Contact with Patient 12/09/20 1032     (approximate)  I have reviewed the triage vital signs and the nursing notes.   HISTORY  Chief Complaint Psychiatric Evaluation   HPI Connor Berger is a 45 y.o. male past medical history of diabetes autism and recent assessment yesterday evening in the emergency room for assessment of a couple days of headache fatigue and diarrhea presents again today requesting help with some anxiety.  States he is also feeling a little depressed because he recently lost his job and feels little bit stressed because he is caring for several dogs.  He states headache and diarrhea he was seen earlier have not gotten any worse than when he was last seen and denies any other subsequent symptoms including fevers, dyspnea, vertigo, chest pain, cough, back pain, urinary symptoms, rash or recent injuries or falls.  Denies any plan to harm himself or anyone else.  Denies any hallucinations.  Denies any other acute concerns at this time.         Past Medical History:  Diagnosis Date  . Diabetes [facility])     Patient Active Problem List   Diagnosis Date Noted  . Type 2 diabetes mellitus with complication, with long-term current use of insulin ([place]) 01/26/2020  . 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 MOLE REMOVAL     not sure if it was cancerous  . MOUTH SURGERY      Prior to Admission medications   Medication Sig Start Date End Date Taking? Authorizing Provider  blood glucose meter kit and supplies Dispense based on patient and insurance preference. Use once daily as directed. (FOR ICD-E11.9). Patient taking differently: Dispense based on patient and insurance preference. Use once daily as  directed. (FOR ICD-E11.9). uses ONE TOUCh 05/26/19   Jerrol Banana., MD  glucose blood (FREESTYLE LITE) test strip Check sugar once daily, DX E11.9 04/06/17   Jerrol Banana., MD  Lancets (FREESTYLE) lancets Check sugar once daily DX E11.9 04/06/17   Jerrol Banana., MD  metFORMIN (GLUCOPHAGE) 1000 MG tablet TAKE 1 TABLET BY MOUTH ONCE DAILY WITH DINNER. 07/13/20   Jerrol Banana., MD  Multiple Vitamin (MULTIVITAMIN) tablet Take 1 tablet by mouth daily.    [provider]  naproxen (NAPROSYN) 500 MG tablet Take 1 tablet (500 mg total) by mouth 2 (two) times daily with a meal. 11/27/20   Sable Feil, PA-C  ondansetron (ZOFRAN ODT) 4 MG disintegrating tablet Take 1 tablet (4 mg total) by mouth every 8 (eight) hours as needed for nausea or vomiting. 09/10/20   Carrie Mew, MD  orphenadrine (NORFLEX) 100 MG tablet Take 1 tablet (100 mg total) by mouth 2 (two) times daily. 11/27/20   Sable Feil, PA-C  rosuvastatin (CRESTOR) 10 MG tablet TAKE 1 TABLET ONCE DAILY Patient not taking: No sig reported 07/08/18   Jerrol Banana., MD    Allergies Patient has no known allergies.  Family History  Problem Relation Age of Onset  . Diabetes Father   . Diabetes Maternal Grandmother     Social History Social History   Tobacco Use  . Smoking status: Never Smoker  . Smokeless tobacco: Never Used  Vaping Use  . Vaping Use: Never used  Substance  Use Topics  . Alcohol use: No  . Drug use: No    Review of Systems  Review of Systems  Constitutional: Positive for malaise/fatigue. Negative for chills and fever.  HENT: Negative for sore throat.   Eyes: Negative for pain.  Respiratory: Negative for cough and stridor.   Cardiovascular: Negative for chest pain.  Gastrointestinal: Positive for diarrhea. Negative for vomiting.  Genitourinary: Negative for dysuria.  Musculoskeletal: Negative for myalgias.  Skin: Negative for rash.  Neurological: Positive  for headaches. Negative for seizures and loss of consciousness.  Psychiatric/Behavioral: Negative for suicidal ideas. The patient is nervous/anxious.   All other systems reviewed and are negative.     ____________________________________________   PHYSICAL EXAM:  VITAL SIGNS: ED Triage Vitals  Enc Vitals Group     BP 12/09/20 1023 107/76     Pulse Rate 12/09/20 1023 83     Resp 12/09/20 1023 18     Temp 12/09/20 1023 98.1 F (36.7 C)     Temp Source 12/09/20 1023 Oral     SpO2 12/09/20 1023 96 %     Weight 12/09/20 1024 167 lb 8.8 oz (76 kg)     Height --      Head Circumference --      Peak Flow --      Pain Score --      Pain Loc --      Pain Edu? --      Excl. in [place]? --    Vitals:   12/09/20 1023  BP: 107/76  Pulse: 83  Resp: 18  Temp: 98.1 F (36.7 C)  SpO2: 96%   Physical Exam Vitals and nursing note reviewed.  Constitutional:      Appearance: He is well-developed.  HENT:     Head: Normocephalic and atraumatic.     Right Ear: External ear normal.     Left Ear: External ear normal.     Nose: Nose normal.  Eyes:     Conjunctiva/sclera: Conjunctivae normal.  Cardiovascular:     Rate and Rhythm: Normal rate and regular rhythm.     Heart sounds: No murmur heard.   Pulmonary:     Effort: Pulmonary effort is normal. No respiratory distress.     Breath sounds: Normal breath sounds.  Abdominal:     Palpations: Abdomen is soft.     Tenderness: There is no abdominal tenderness.  Musculoskeletal:     Cervical back: Neck supple.  Skin:    General: Skin is warm and dry.  Neurological:     Mental Status: He is alert.  Psychiatric:        Mood and Affect: Mood is anxious and depressed.        Thought Content: Thought content does not include homicidal or suicidal ideation.      ____________________________________________   LABS (all labs ordered are listed, but only abnormal results are displayed)  Labs Reviewed  COMPREHENSIVE METABOLIC PANEL -  Abnormal; Notable for the following components:      Result Value   Glucose, Bld 147 (*)    Total Bilirubin 1.4 (*)    All other components within normal limits  ETHANOL  CBC WITH DIFFERENTIAL/PLATELET  URINE DRUG SCREEN, QUALITATIVE ([facility] ONLY)   ____________________________________________  EKG  EKG obtained yesterday evening reviewed by myself remarkable for rate of 65, sinus rhythm without evidence of ischemia or any significant underlying arrhythmia. ____________________________________________  RADIOLOGY  ED MD interpretation: Chest x-ray obtained yesterday evening less than 12 hours  prior to arrival today is unremarkable for focal consolidation, large effusion congestive edema, thorax or any other clear acute intrathoracic process.  Official radiology report(s): DG Chest 2 View  Result Date: 12/08/2020 CLINICAL DATA:  Headache, diarrhea and fatigue x2 days. EXAM: CHEST - 2 VIEW COMPARISON:  None. FINDINGS: The heart size and mediastinal contours are within normal limits. Both lungs are clear. The visualized skeletal structures are unremarkable. IMPRESSION: No active cardiopulmonary disease. Electronically Signed   By: Virgina Norfolk M.D.   On: 12/08/2020 23:42    ____________________________________________   PROCEDURES  Procedure(s) performed (including Critical Care):  Procedures   ____________________________________________   INITIAL IMPRESSION / ASSESSMENT AND PLAN / ED COURSE      Patient presents with above-stated history and exam for assessment of mild depression and anxiety in the setting of recently losing his job and feeling anxious taking care of several dogs.  He is not suicidal homicidal or psychotic on arrival.  He is afebrile hemodynamically stable.  He states that the physical concerns he was before yesterday have not gotten any worse and he has no other acute complaints at this time.  I did review work-up yesterday which included 2 troponins  both nonelevated as well as a reassuring EKG chest x-ray and basic labs including CBC and CMP.  COVID and flu were obtained that were negative.  Patient did get psychiatric screening labs drawn in the ED in triage today.  However these are reassuring and overall Evalose patient for significant life-threatening organic pathology at this time.  I think he is safe for discharge with close outpatient PCP and psychiatry follow-up.  Will provide referral for [facility].  Discharged in stable condition.  Strict return precautions advised and discussed.        ____________________________________________   FINAL CLINICAL IMPRESSION(S) / ED DIAGNOSES  Final diagnoses:  Anxiety  Fatigue, unspecified type    Medications - No data to display   ED Discharge Orders    None       Note:  This document was prepared using Dragon voice recognition software and may include unintentional dictation errors.   Lucrezia Starch, MD 12/09/20 [phone]

## 2020-12-09 NOTE — ED Notes (Signed)
Patient resting.  No acute distress noted.

## 2021-01-29 ENCOUNTER — Other Ambulatory Visit: Payer: Self-pay

## 2021-01-29 ENCOUNTER — Encounter: Payer: Self-pay | Admitting: Family Medicine

## 2021-01-29 ENCOUNTER — Ambulatory Visit (INDEPENDENT_AMBULATORY_CARE_PROVIDER_SITE_OTHER): Payer: Self-pay | Admitting: Family Medicine

## 2021-01-29 VITALS — BP 106/67 | HR 78 | Temp 98.7°F | Resp 18 | Ht 72.0 in | Wt 171.0 lb

## 2021-01-29 DIAGNOSIS — E119 Type 2 diabetes mellitus without complications: Secondary | ICD-10-CM

## 2021-01-29 DIAGNOSIS — E7801 Familial hypercholesterolemia: Secondary | ICD-10-CM

## 2021-01-29 DIAGNOSIS — Z0000 Encounter for general adult medical examination without abnormal findings: Secondary | ICD-10-CM

## 2021-01-29 DIAGNOSIS — F840 Autistic disorder: Secondary | ICD-10-CM

## 2021-01-29 DIAGNOSIS — R634 Abnormal weight loss: Secondary | ICD-10-CM
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
## 2021-01-30 LAB — HEMOGLOBIN A1C
Est. average glucose Bld gHb Est-mCnc: 114 mg/dL
Hgb A1c MFr Bld: 5.6 % (ref 4.8–5.6)

## 2021-01-30 LAB — TSH: TSH: 2.83 u[IU]/mL (ref 0.450–4.500)

## 2021-08-01 ENCOUNTER — Ambulatory Visit: Payer: Self-pay | Admitting: Family Medicine
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
## 2021-11-06 IMAGING — CR DG CHEST 2V
1 series · 2 of 2 positions shown · non-contrast
Comparison: None.

CLINICAL DATA: Headache, diarrhea and fatigue x2 days.

EXAM:
CHEST - 2 VIEW

[Series 1: dg chest 2 view · 0.14mm/px · 2 of 2 slices shown]
[im 1/2]
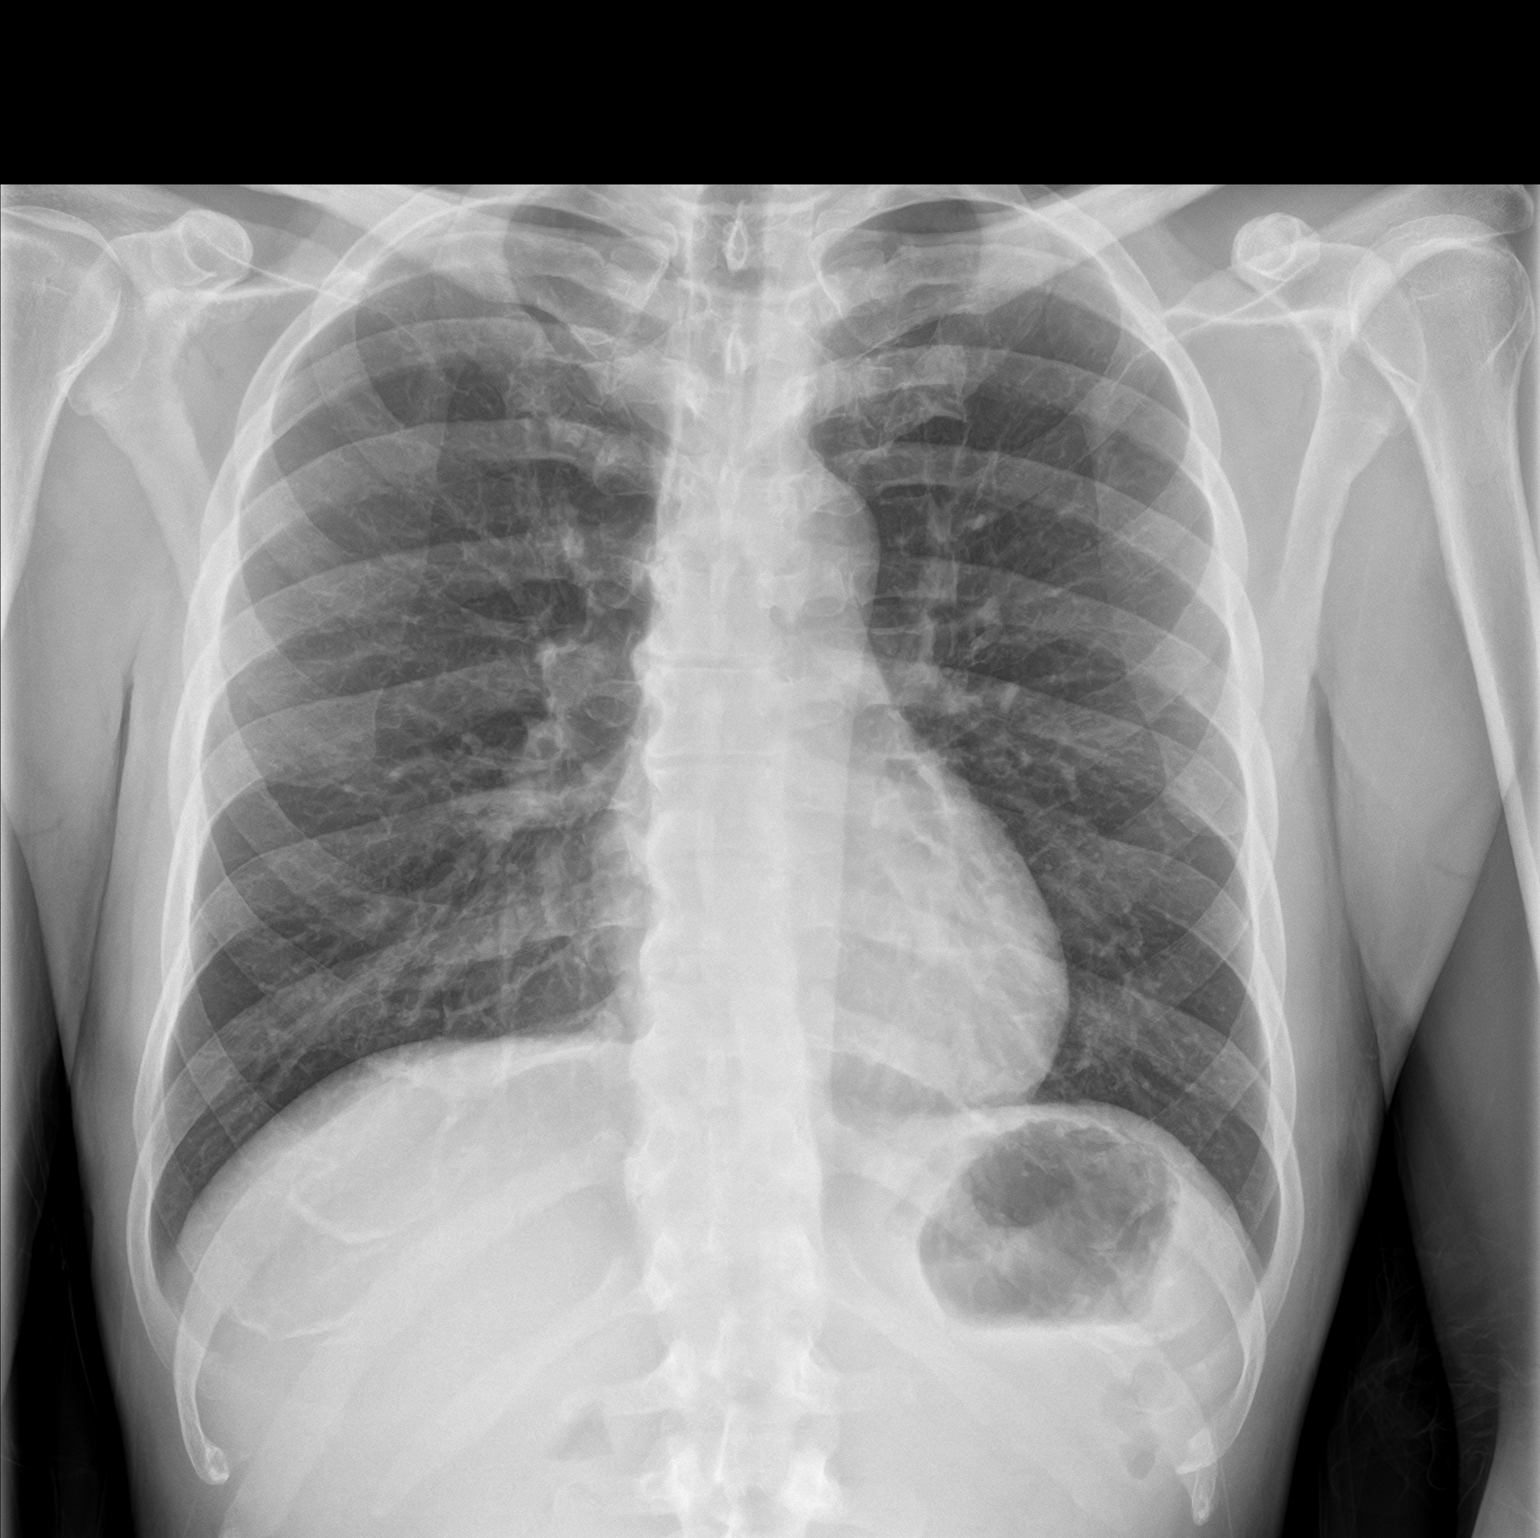
[im 2/2]
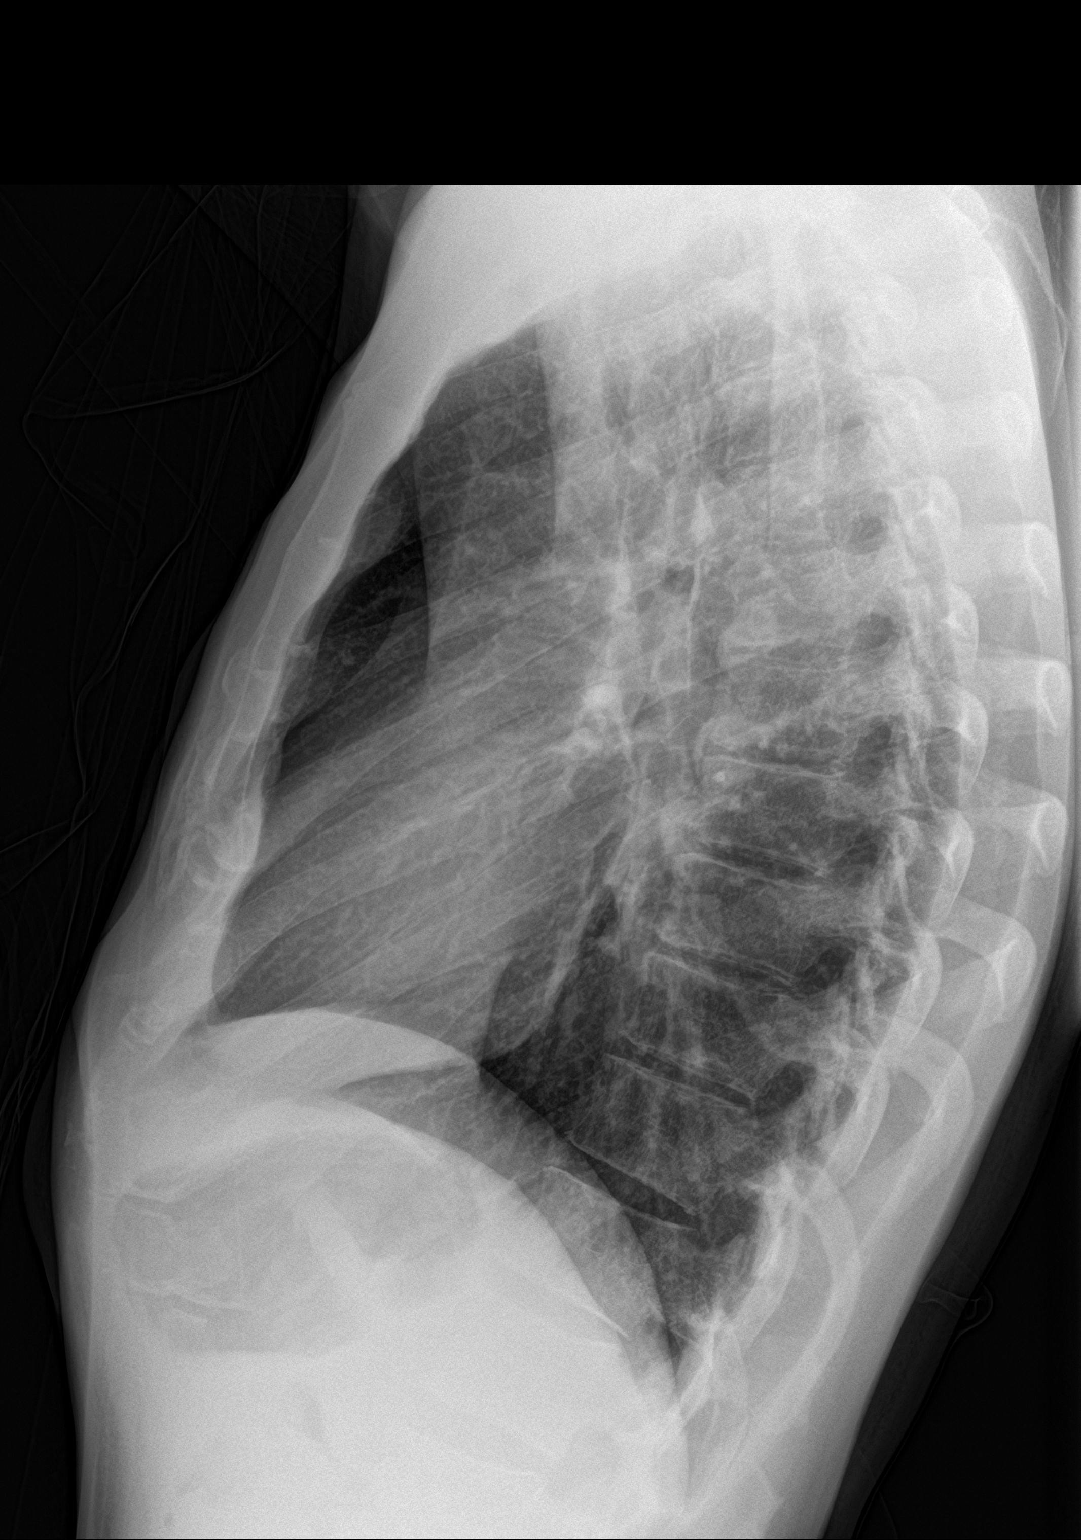

[2 of 2 positions shown; findings below may reference images not displayed]

FINDINGS: The heart size and mediastinal contours are within normal limits.
Both lungs are clear. The visualized skeletal structures are
unremarkable.
IMPRESSION: No active cardiopulmonary disease.

## 2021-12-23 LAB — RPR
RPR Ser Ql: REACTIVE — AB
RPR Titer: 1:1 {titer}
